# Patient Record
Sex: Female | Born: 1950
Health system: Southern US, Community
[De-identification: ages and names within clinical notes are randomized; demographics above are authoritative.]

## PROBLEM LIST (undated history)

## (undated) DIAGNOSIS — Z720 Tobacco use: Secondary | ICD-10-CM

## (undated) DIAGNOSIS — I10 Essential (primary) hypertension: Secondary | ICD-10-CM

## (undated) DIAGNOSIS — F411 Generalized anxiety disorder: Secondary | ICD-10-CM

## (undated) DIAGNOSIS — J449 Chronic obstructive pulmonary disease, unspecified: Secondary | ICD-10-CM

## (undated) DIAGNOSIS — J302 Other seasonal allergic rhinitis: Secondary | ICD-10-CM

## (undated) HISTORY — DX: Essential (primary) hypertension: I10

## (undated) HISTORY — DX: Other seasonal allergic rhinitis: J30.2

## (undated) HISTORY — DX: Chronic obstructive pulmonary disease, unspecified: J44.9

## (undated) HISTORY — DX: Tobacco use: Z72.0

## (undated) HISTORY — DX: Generalized anxiety disorder: F41.1

---

## 2016-07-03 DIAGNOSIS — J449 Chronic obstructive pulmonary disease, unspecified: Secondary | ICD-10-CM | POA: Diagnosis not present

## 2016-07-03 DIAGNOSIS — Z23 Encounter for immunization: Secondary | ICD-10-CM | POA: Diagnosis not present

## 2016-07-03 DIAGNOSIS — M545 Low back pain: Secondary | ICD-10-CM | POA: Diagnosis not present

## 2016-10-29 DIAGNOSIS — R05 Cough: Secondary | ICD-10-CM | POA: Diagnosis not present

## 2016-10-30 DIAGNOSIS — R0602 Shortness of breath: Secondary | ICD-10-CM | POA: Diagnosis not present

## 2016-10-30 DIAGNOSIS — R05 Cough: Secondary | ICD-10-CM | POA: Diagnosis not present

## 2016-11-15 DIAGNOSIS — J40 Bronchitis, not specified as acute or chronic: Secondary | ICD-10-CM | POA: Diagnosis not present

## 2016-11-15 DIAGNOSIS — R05 Cough: Secondary | ICD-10-CM | POA: Diagnosis not present

## 2016-11-19 DIAGNOSIS — J441 Chronic obstructive pulmonary disease with (acute) exacerbation: Secondary | ICD-10-CM | POA: Diagnosis not present

## 2016-11-19 DIAGNOSIS — J189 Pneumonia, unspecified organism: Secondary | ICD-10-CM | POA: Diagnosis not present

## 2016-11-19 DIAGNOSIS — K219 Gastro-esophageal reflux disease without esophagitis: Secondary | ICD-10-CM | POA: Diagnosis present

## 2016-11-19 DIAGNOSIS — E878 Other disorders of electrolyte and fluid balance, not elsewhere classified: Secondary | ICD-10-CM | POA: Diagnosis not present

## 2016-11-19 DIAGNOSIS — E042 Nontoxic multinodular goiter: Secondary | ICD-10-CM | POA: Diagnosis not present

## 2016-11-19 DIAGNOSIS — Z87891 Personal history of nicotine dependence: Secondary | ICD-10-CM | POA: Diagnosis not present

## 2016-11-19 DIAGNOSIS — I10 Essential (primary) hypertension: Secondary | ICD-10-CM | POA: Diagnosis present

## 2016-11-19 DIAGNOSIS — I513 Intracardiac thrombosis, not elsewhere classified: Secondary | ICD-10-CM | POA: Diagnosis present

## 2016-11-19 DIAGNOSIS — R069 Unspecified abnormalities of breathing: Secondary | ICD-10-CM | POA: Diagnosis not present

## 2016-11-19 DIAGNOSIS — E871 Hypo-osmolality and hyponatremia: Secondary | ICD-10-CM | POA: Diagnosis present

## 2016-11-19 DIAGNOSIS — J440 Chronic obstructive pulmonary disease with acute lower respiratory infection: Secondary | ICD-10-CM | POA: Diagnosis not present

## 2016-11-19 DIAGNOSIS — Z7982 Long term (current) use of aspirin: Secondary | ICD-10-CM | POA: Diagnosis not present

## 2016-11-19 DIAGNOSIS — I7409 Other arterial embolism and thrombosis of abdominal aorta: Secondary | ICD-10-CM | POA: Diagnosis not present

## 2016-11-19 DIAGNOSIS — J9621 Acute and chronic respiratory failure with hypoxia: Secondary | ICD-10-CM | POA: Diagnosis not present

## 2016-11-19 DIAGNOSIS — E041 Nontoxic single thyroid nodule: Secondary | ICD-10-CM | POA: Diagnosis not present

## 2016-11-19 DIAGNOSIS — Z79899 Other long term (current) drug therapy: Secondary | ICD-10-CM | POA: Diagnosis not present

## 2016-11-19 DIAGNOSIS — R0602 Shortness of breath: Secondary | ICD-10-CM | POA: Diagnosis not present

## 2016-11-19 DIAGNOSIS — R05 Cough: Secondary | ICD-10-CM | POA: Diagnosis not present

## 2016-11-21 DIAGNOSIS — E041 Nontoxic single thyroid nodule: Secondary | ICD-10-CM

## 2016-11-21 DIAGNOSIS — J441 Chronic obstructive pulmonary disease with (acute) exacerbation: Secondary | ICD-10-CM

## 2016-11-21 DIAGNOSIS — J9621 Acute and chronic respiratory failure with hypoxia: Secondary | ICD-10-CM

## 2016-11-21 DIAGNOSIS — J189 Pneumonia, unspecified organism: Secondary | ICD-10-CM

## 2016-11-21 DIAGNOSIS — E878 Other disorders of electrolyte and fluid balance, not elsewhere classified: Secondary | ICD-10-CM

## 2016-11-29 DIAGNOSIS — J189 Pneumonia, unspecified organism: Secondary | ICD-10-CM | POA: Diagnosis not present

## 2016-11-29 DIAGNOSIS — J449 Chronic obstructive pulmonary disease, unspecified: Secondary | ICD-10-CM | POA: Diagnosis not present

## 2016-12-27 DIAGNOSIS — J449 Chronic obstructive pulmonary disease, unspecified: Secondary | ICD-10-CM | POA: Diagnosis not present

## 2017-04-29 DIAGNOSIS — H109 Unspecified conjunctivitis: Secondary | ICD-10-CM | POA: Diagnosis not present

## 2017-04-29 DIAGNOSIS — L03818 Cellulitis of other sites: Secondary | ICD-10-CM | POA: Diagnosis not present

## 2017-07-18 DIAGNOSIS — J449 Chronic obstructive pulmonary disease, unspecified: Secondary | ICD-10-CM | POA: Diagnosis not present

## 2017-07-18 DIAGNOSIS — R0602 Shortness of breath: Secondary | ICD-10-CM | POA: Diagnosis not present

## 2017-07-21 DIAGNOSIS — R0602 Shortness of breath: Secondary | ICD-10-CM | POA: Diagnosis not present

## 2017-07-21 DIAGNOSIS — J449 Chronic obstructive pulmonary disease, unspecified: Secondary | ICD-10-CM | POA: Diagnosis not present

## 2017-07-22 DIAGNOSIS — J449 Chronic obstructive pulmonary disease, unspecified: Secondary | ICD-10-CM | POA: Diagnosis not present

## 2017-07-22 DIAGNOSIS — R0602 Shortness of breath: Secondary | ICD-10-CM | POA: Diagnosis not present

## 2017-08-11 DIAGNOSIS — J441 Chronic obstructive pulmonary disease with (acute) exacerbation: Secondary | ICD-10-CM | POA: Diagnosis not present

## 2017-09-15 DIAGNOSIS — J449 Chronic obstructive pulmonary disease, unspecified: Secondary | ICD-10-CM | POA: Diagnosis not present

## 2017-09-15 DIAGNOSIS — Z23 Encounter for immunization: Secondary | ICD-10-CM | POA: Diagnosis not present

## 2017-10-06 DIAGNOSIS — J441 Chronic obstructive pulmonary disease with (acute) exacerbation: Secondary | ICD-10-CM | POA: Diagnosis not present

## 2017-11-21 DIAGNOSIS — J189 Pneumonia, unspecified organism: Secondary | ICD-10-CM | POA: Diagnosis not present

## 2017-12-12 DIAGNOSIS — J441 Chronic obstructive pulmonary disease with (acute) exacerbation: Secondary | ICD-10-CM | POA: Diagnosis not present

## 2017-12-22 DIAGNOSIS — J189 Pneumonia, unspecified organism: Secondary | ICD-10-CM | POA: Diagnosis not present

## 2018-01-02 DIAGNOSIS — R109 Unspecified abdominal pain: Secondary | ICD-10-CM | POA: Diagnosis not present

## 2018-01-02 DIAGNOSIS — T148XXA Other injury of unspecified body region, initial encounter: Secondary | ICD-10-CM | POA: Diagnosis not present

## 2018-01-13 DIAGNOSIS — E876 Hypokalemia: Secondary | ICD-10-CM | POA: Diagnosis not present

## 2018-01-13 DIAGNOSIS — I10 Essential (primary) hypertension: Secondary | ICD-10-CM | POA: Diagnosis not present

## 2018-01-13 DIAGNOSIS — D72828 Other elevated white blood cell count: Secondary | ICD-10-CM | POA: Diagnosis not present

## 2018-01-13 DIAGNOSIS — J9601 Acute respiratory failure with hypoxia: Secondary | ICD-10-CM | POA: Diagnosis not present

## 2018-01-13 DIAGNOSIS — N179 Acute kidney failure, unspecified: Secondary | ICD-10-CM | POA: Diagnosis not present

## 2018-01-13 DIAGNOSIS — E871 Hypo-osmolality and hyponatremia: Secondary | ICD-10-CM | POA: Diagnosis not present

## 2018-01-13 DIAGNOSIS — J441 Chronic obstructive pulmonary disease with (acute) exacerbation: Secondary | ICD-10-CM | POA: Diagnosis not present

## 2018-01-13 DIAGNOSIS — J439 Emphysema, unspecified: Secondary | ICD-10-CM | POA: Diagnosis not present

## 2018-01-13 DIAGNOSIS — R0902 Hypoxemia: Secondary | ICD-10-CM | POA: Diagnosis not present

## 2018-01-14 DIAGNOSIS — R0902 Hypoxemia: Secondary | ICD-10-CM | POA: Diagnosis not present

## 2018-01-14 DIAGNOSIS — I10 Essential (primary) hypertension: Secondary | ICD-10-CM | POA: Diagnosis not present

## 2018-01-14 DIAGNOSIS — J9601 Acute respiratory failure with hypoxia: Secondary | ICD-10-CM | POA: Diagnosis not present

## 2018-01-14 DIAGNOSIS — D72828 Other elevated white blood cell count: Secondary | ICD-10-CM | POA: Diagnosis not present

## 2018-01-14 DIAGNOSIS — E876 Hypokalemia: Secondary | ICD-10-CM | POA: Diagnosis not present

## 2018-01-14 DIAGNOSIS — R0602 Shortness of breath: Secondary | ICD-10-CM | POA: Diagnosis not present

## 2018-01-14 DIAGNOSIS — N179 Acute kidney failure, unspecified: Secondary | ICD-10-CM | POA: Diagnosis not present

## 2018-01-14 DIAGNOSIS — E871 Hypo-osmolality and hyponatremia: Secondary | ICD-10-CM | POA: Diagnosis not present

## 2018-01-14 DIAGNOSIS — R739 Hyperglycemia, unspecified: Secondary | ICD-10-CM | POA: Diagnosis not present

## 2018-01-14 DIAGNOSIS — K219 Gastro-esophageal reflux disease without esophagitis: Secondary | ICD-10-CM | POA: Diagnosis not present

## 2018-01-14 DIAGNOSIS — J441 Chronic obstructive pulmonary disease with (acute) exacerbation: Secondary | ICD-10-CM | POA: Diagnosis not present

## 2018-01-14 DIAGNOSIS — J439 Emphysema, unspecified: Secondary | ICD-10-CM | POA: Diagnosis not present

## 2018-01-17 DIAGNOSIS — R0602 Shortness of breath: Secondary | ICD-10-CM | POA: Diagnosis not present

## 2018-01-17 DIAGNOSIS — I10 Essential (primary) hypertension: Secondary | ICD-10-CM | POA: Diagnosis not present

## 2018-01-17 DIAGNOSIS — J449 Chronic obstructive pulmonary disease, unspecified: Secondary | ICD-10-CM | POA: Diagnosis not present

## 2018-01-17 DIAGNOSIS — K210 Gastro-esophageal reflux disease with esophagitis: Secondary | ICD-10-CM | POA: Diagnosis not present

## 2018-01-19 DIAGNOSIS — J189 Pneumonia, unspecified organism: Secondary | ICD-10-CM | POA: Diagnosis not present

## 2018-01-21 DIAGNOSIS — J449 Chronic obstructive pulmonary disease, unspecified: Secondary | ICD-10-CM | POA: Diagnosis not present

## 2018-02-17 DIAGNOSIS — K210 Gastro-esophageal reflux disease with esophagitis: Secondary | ICD-10-CM | POA: Diagnosis not present

## 2018-02-17 DIAGNOSIS — R0602 Shortness of breath: Secondary | ICD-10-CM | POA: Diagnosis not present

## 2018-02-17 DIAGNOSIS — J449 Chronic obstructive pulmonary disease, unspecified: Secondary | ICD-10-CM | POA: Diagnosis not present

## 2018-02-17 DIAGNOSIS — I10 Essential (primary) hypertension: Secondary | ICD-10-CM | POA: Diagnosis not present

## 2018-02-19 DIAGNOSIS — J40 Bronchitis, not specified as acute or chronic: Secondary | ICD-10-CM | POA: Diagnosis not present

## 2018-02-19 DIAGNOSIS — J302 Other seasonal allergic rhinitis: Secondary | ICD-10-CM | POA: Diagnosis not present

## 2018-03-19 DIAGNOSIS — I10 Essential (primary) hypertension: Secondary | ICD-10-CM | POA: Diagnosis not present

## 2018-03-19 DIAGNOSIS — R0602 Shortness of breath: Secondary | ICD-10-CM | POA: Diagnosis not present

## 2018-03-19 DIAGNOSIS — K210 Gastro-esophageal reflux disease with esophagitis: Secondary | ICD-10-CM | POA: Diagnosis not present

## 2018-03-19 DIAGNOSIS — J449 Chronic obstructive pulmonary disease, unspecified: Secondary | ICD-10-CM | POA: Diagnosis not present

## 2018-04-19 DIAGNOSIS — K210 Gastro-esophageal reflux disease with esophagitis: Secondary | ICD-10-CM | POA: Diagnosis not present

## 2018-04-19 DIAGNOSIS — R0602 Shortness of breath: Secondary | ICD-10-CM | POA: Diagnosis not present

## 2018-04-19 DIAGNOSIS — J449 Chronic obstructive pulmonary disease, unspecified: Secondary | ICD-10-CM | POA: Diagnosis not present

## 2018-04-19 DIAGNOSIS — I10 Essential (primary) hypertension: Secondary | ICD-10-CM | POA: Diagnosis not present

## 2018-04-28 DIAGNOSIS — Z6828 Body mass index (BMI) 28.0-28.9, adult: Secondary | ICD-10-CM | POA: Diagnosis not present

## 2018-04-28 DIAGNOSIS — J449 Chronic obstructive pulmonary disease, unspecified: Secondary | ICD-10-CM | POA: Diagnosis not present

## 2018-04-28 DIAGNOSIS — Z0000 Encounter for general adult medical examination without abnormal findings: Secondary | ICD-10-CM | POA: Diagnosis not present

## 2018-04-28 DIAGNOSIS — J302 Other seasonal allergic rhinitis: Secondary | ICD-10-CM | POA: Diagnosis not present

## 2018-04-28 DIAGNOSIS — E663 Overweight: Secondary | ICD-10-CM | POA: Diagnosis not present

## 2018-04-28 DIAGNOSIS — I10 Essential (primary) hypertension: Secondary | ICD-10-CM | POA: Diagnosis not present

## 2018-04-28 DIAGNOSIS — F411 Generalized anxiety disorder: Secondary | ICD-10-CM | POA: Diagnosis not present

## 2018-04-28 DIAGNOSIS — E559 Vitamin D deficiency, unspecified: Secondary | ICD-10-CM | POA: Diagnosis not present

## 2018-04-28 DIAGNOSIS — Z1389 Encounter for screening for other disorder: Secondary | ICD-10-CM | POA: Diagnosis not present

## 2018-04-29 DIAGNOSIS — J449 Chronic obstructive pulmonary disease, unspecified: Secondary | ICD-10-CM | POA: Diagnosis not present

## 2018-04-29 DIAGNOSIS — I10 Essential (primary) hypertension: Secondary | ICD-10-CM | POA: Diagnosis not present

## 2018-04-29 DIAGNOSIS — E559 Vitamin D deficiency, unspecified: Secondary | ICD-10-CM | POA: Diagnosis not present

## 2018-05-06 DIAGNOSIS — Z1231 Encounter for screening mammogram for malignant neoplasm of breast: Secondary | ICD-10-CM | POA: Diagnosis not present

## 2018-07-29 DIAGNOSIS — Z23 Encounter for immunization: Secondary | ICD-10-CM | POA: Diagnosis not present

## 2018-07-29 DIAGNOSIS — I10 Essential (primary) hypertension: Secondary | ICD-10-CM | POA: Diagnosis not present

## 2018-08-21 DIAGNOSIS — J189 Pneumonia, unspecified organism: Secondary | ICD-10-CM | POA: Diagnosis not present

## 2018-08-28 DIAGNOSIS — J441 Chronic obstructive pulmonary disease with (acute) exacerbation: Secondary | ICD-10-CM | POA: Diagnosis not present

## 2018-10-02 DIAGNOSIS — J209 Acute bronchitis, unspecified: Secondary | ICD-10-CM | POA: Diagnosis not present

## 2018-10-02 DIAGNOSIS — J441 Chronic obstructive pulmonary disease with (acute) exacerbation: Secondary | ICD-10-CM | POA: Diagnosis not present

## 2018-10-05 DIAGNOSIS — R0602 Shortness of breath: Secondary | ICD-10-CM | POA: Diagnosis not present

## 2018-10-05 DIAGNOSIS — R079 Chest pain, unspecified: Secondary | ICD-10-CM | POA: Diagnosis not present

## 2018-10-05 DIAGNOSIS — I700 Atherosclerosis of aorta: Secondary | ICD-10-CM | POA: Diagnosis not present

## 2018-10-05 DIAGNOSIS — J441 Chronic obstructive pulmonary disease with (acute) exacerbation: Secondary | ICD-10-CM | POA: Diagnosis not present

## 2018-11-23 DIAGNOSIS — I10 Essential (primary) hypertension: Secondary | ICD-10-CM | POA: Diagnosis not present

## 2018-11-23 DIAGNOSIS — J449 Chronic obstructive pulmonary disease, unspecified: Secondary | ICD-10-CM | POA: Diagnosis not present

## 2019-01-18 DIAGNOSIS — R062 Wheezing: Secondary | ICD-10-CM | POA: Diagnosis not present

## 2019-01-18 DIAGNOSIS — B349 Viral infection, unspecified: Secondary | ICD-10-CM | POA: Diagnosis not present

## 2019-01-18 DIAGNOSIS — R509 Fever, unspecified: Secondary | ICD-10-CM | POA: Diagnosis not present

## 2019-01-29 DIAGNOSIS — J441 Chronic obstructive pulmonary disease with (acute) exacerbation: Secondary | ICD-10-CM | POA: Diagnosis not present

## 2019-02-26 DIAGNOSIS — J449 Chronic obstructive pulmonary disease, unspecified: Secondary | ICD-10-CM | POA: Diagnosis not present

## 2019-02-26 DIAGNOSIS — M25531 Pain in right wrist: Secondary | ICD-10-CM | POA: Diagnosis not present

## 2019-03-10 DIAGNOSIS — J441 Chronic obstructive pulmonary disease with (acute) exacerbation: Secondary | ICD-10-CM | POA: Diagnosis not present

## 2019-04-12 DIAGNOSIS — J302 Other seasonal allergic rhinitis: Secondary | ICD-10-CM | POA: Diagnosis not present

## 2019-04-12 DIAGNOSIS — J441 Chronic obstructive pulmonary disease with (acute) exacerbation: Secondary | ICD-10-CM | POA: Diagnosis not present

## 2019-04-22 DIAGNOSIS — J441 Chronic obstructive pulmonary disease with (acute) exacerbation: Secondary | ICD-10-CM | POA: Diagnosis not present

## 2019-05-22 DIAGNOSIS — J441 Chronic obstructive pulmonary disease with (acute) exacerbation: Secondary | ICD-10-CM | POA: Diagnosis not present

## 2019-05-26 ENCOUNTER — Encounter: Payer: Self-pay | Admitting: Pulmonary Disease

## 2019-05-26 ENCOUNTER — Ambulatory Visit (INDEPENDENT_AMBULATORY_CARE_PROVIDER_SITE_OTHER): Payer: Medicare HMO | Admitting: Pulmonary Disease

## 2019-05-26 ENCOUNTER — Other Ambulatory Visit: Payer: Self-pay

## 2019-05-26 VITALS — BP 128/66 | HR 91 | Temp 97.9°F | Ht 67.0 in | Wt 154.4 lb

## 2019-05-26 DIAGNOSIS — J9611 Chronic respiratory failure with hypoxia: Secondary | ICD-10-CM | POA: Diagnosis not present

## 2019-05-26 DIAGNOSIS — R0602 Shortness of breath: Secondary | ICD-10-CM | POA: Diagnosis not present

## 2019-05-26 NOTE — Progress Notes (Signed)
Subjective:    Patient ID: Andrea Collins, female    DOB: Mar 22, 1951, 68 y.o.   MRN: [id number]  Patient with a history of COPD emphysema  History of COPD for many years Has had 2 recent exacerbations  Recently started on oxygen supplementation She is deconditioned  Had a CT scan performed a couple years ago which showed a pneumonic process, thyroid nodule  Recently has been using Symbicort, Spiriva  Occasional cough, not really bringing up any secretions  Currently uses about 3 L of oxygen   Review of Systems  Constitutional: Negative for fever and unexpected weight change.  HENT: Negative for congestion, dental problem, ear pain, nosebleeds, postnasal drip, rhinorrhea, sinus pressure, sneezing, sore throat and trouble swallowing.   Eyes: Negative for redness and itching.  Respiratory: Positive for cough, shortness of breath and wheezing. Negative for chest tightness.   Cardiovascular: Negative for palpitations and leg swelling.  Gastrointestinal: Negative for nausea and vomiting.  Genitourinary: Negative for dysuria.  Musculoskeletal: Negative for joint swelling.  Skin: Negative for rash.  Allergic/Immunologic: Negative.  Negative for environmental allergies, food allergies and immunocompromised state.  Neurological: Negative for headaches.  Hematological: Does not bruise/bleed easily.  Psychiatric/Behavioral: Negative for dysphoric mood. The patient is not nervous/anxious.    Family History  Problem Relation Age of Onset  . Breast cancer Maternal Grandmother   . Heart attack Maternal Grandfather    History reviewed. No pertinent past medical history. Social History   Socioeconomic History  . Marital status: Widowed    Spouse name: Not on file  . Number of children: Not on file  . Years of education: Not on file  . Highest education level: Not on file  Occupational History  . Not on file  Social Needs  . Financial resource strain: Not on file  . Food  insecurity    Worry: Not on file    Inability: Not on file  . Transportation needs    Medical: Not on file    Non-medical: Not on file  Tobacco Use  . Smoking status: Former Smoker    Packs/day: 1.00    Years: 45.00    Pack years: 45.00    Types: Cigarettes    Start date: 1970    Quit date: 2015    Years since quitting: 5.5  . Smokeless tobacco: Never Used  . Tobacco comment: 1-2 packs a day  Substance and Sexual Activity  . Alcohol use: Not on file  . Drug use: Not on file  . Sexual activity: Not on file  Lifestyle  . Physical activity    Days per week: Not on file    Minutes per session: Not on file  . Stress: Not on file  Relationships  . Social Musicianconnections    Talks on phone: Not on file    Gets together: Not on file    Attends religious service: Not on file    Active member of club or organization: Not on file    Attends meetings of clubs or organizations: Not on file    Relationship status: Not on file  . Intimate partner violence    Fear of current or ex partner: Not on file    Emotionally abused: Not on file    Physically abused: Not on file    Forced sexual activity: Not on file  Other Topics Concern  . Not on file  Social History Narrative  . Not on file       Objective:  Physical Exam Constitutional:      Appearance: Normal appearance.  HENT:     Head: Normocephalic and atraumatic.  Neck:     Musculoskeletal: Normal range of motion and neck supple.  Cardiovascular:     Rate and Rhythm: Normal rate and regular rhythm.     Pulses: Normal pulses.     Heart sounds: Normal heart sounds. No murmur.  Pulmonary:     Effort: Pulmonary effort is normal. No respiratory distress.     Breath sounds: Rhonchi present.  Abdominal:     General: There is no distension.     Palpations: There is no mass.     Tenderness: There is no abdominal tenderness.  Musculoskeletal: Normal range of motion.        General: No swelling or tenderness.  Skin:    General:  Skin is warm and dry.     Coloration: Skin is not jaundiced or pale.  Neurological:     General: No focal deficit present.     Mental Status: She is alert and oriented to person, place, and time.  Psychiatric:        Mood and Affect: Mood normal.        Behavior: Behavior normal.    Vitals:   05/26/19 1524  BP: 128/66  Pulse: 91  Temp: 97.9 F (36.6 C)  SpO2: 92%      Assessment & Plan:  .  Chronic hypoxemic respiratory failure -Continue oxygen supplementation -Continue bronchodilator treatment  COPD/emphysema -Recent decompensation -Deconditioning -Continue bronchodilators-currently on Symbicort, Spiriva, albuterol  Deconditioning -Importance of physical activity discussed  Plan: -We will obtain CT scan of the chest-assess emphysema -We will obtain a pulmonary function study to assess severity of COPD - Continue bronchodilators Physical activity as discussed Continue oxygen supplementation  I will see you in the office in about 2 to 3 months

## 2019-05-26 NOTE — Patient Instructions (Signed)
Shortness of breath from COPD Emphysema  We will obtain a CT scan of your chest-it does help characterize the emphysema, this will also show if there is any abnormality regarding your recent fall  Pulmonary function test  Continue use of Spiriva, Symbicort, albuterol Continue oxygen use  Encourage regular exercise as discussed  We will see you back in the office in 2 to 3 months

## 2019-05-31 ENCOUNTER — Telehealth: Payer: Self-pay | Admitting: Pulmonary Disease

## 2019-06-01 NOTE — Telephone Encounter (Signed)
Spoke to [email or web address]  they need records to precert this ct will gather records and fax them to Rolanda Lundborg

## 2019-06-02 NOTE — Telephone Encounter (Signed)
Ok thanks [place].

## 2019-06-10 ENCOUNTER — Ambulatory Visit (INDEPENDENT_AMBULATORY_CARE_PROVIDER_SITE_OTHER)
Admission: RE | Admit: 2019-06-10 | Discharge: 2019-06-10 | Disposition: A | Discharge status: Home / Self Care | Payer: Medicare HMO | Source: Ambulatory Visit | Attending: Pulmonary Disease | Admitting: Pulmonary Disease

## 2019-06-10 ENCOUNTER — Other Ambulatory Visit: Payer: Self-pay

## 2019-06-10 DIAGNOSIS — R0602 Shortness of breath: Secondary | ICD-10-CM | POA: Diagnosis not present

## 2019-06-11 DIAGNOSIS — F411 Generalized anxiety disorder: Secondary | ICD-10-CM | POA: Diagnosis not present

## 2019-06-11 DIAGNOSIS — J449 Chronic obstructive pulmonary disease, unspecified: Secondary | ICD-10-CM | POA: Diagnosis not present

## 2019-06-11 DIAGNOSIS — Z0000 Encounter for general adult medical examination without abnormal findings: Secondary | ICD-10-CM | POA: Diagnosis not present

## 2019-06-11 DIAGNOSIS — J302 Other seasonal allergic rhinitis: Secondary | ICD-10-CM | POA: Diagnosis not present

## 2019-06-11 DIAGNOSIS — Z87891 Personal history of nicotine dependence: Secondary | ICD-10-CM | POA: Diagnosis not present

## 2019-06-11 DIAGNOSIS — Z1389 Encounter for screening for other disorder: Secondary | ICD-10-CM | POA: Diagnosis not present

## 2019-06-11 DIAGNOSIS — I10 Essential (primary) hypertension: Secondary | ICD-10-CM | POA: Diagnosis not present

## 2019-06-11 DIAGNOSIS — Z6823 Body mass index (BMI) 23.0-23.9, adult: Secondary | ICD-10-CM | POA: Diagnosis not present

## 2019-06-18 ENCOUNTER — Telehealth: Payer: Self-pay | Admitting: Pulmonary Disease

## 2019-06-18 DIAGNOSIS — R911 Solitary pulmonary nodule: Secondary | ICD-10-CM

## 2019-06-18 NOTE — Telephone Encounter (Signed)
Spoke with the pt  She is requesting her CT Chest results from 06/10/19  Please advise thanks

## 2019-06-21 NOTE — Telephone Encounter (Signed)
Called and spoke with pt letting her know the results of the ct and stated to her that we would repeat it in 1 year. Pt verbalized understanding. Order has been placed for ct to be performed in 1 year. Nothing further needed.

## 2019-06-21 NOTE — Telephone Encounter (Signed)
AO is in office today so he should be able to update this at that time.  Dr. Ander Slade, please advise on pt's CT results as she is calling requesting to know the results. Thanks!

## 2019-06-21 NOTE — Telephone Encounter (Signed)
CT chest is significant for emphysema  Incidental 2 mm nodule-very very small, no intervention is required A repeat CT scan of the chest in a year will be ordered

## 2019-06-22 DIAGNOSIS — J441 Chronic obstructive pulmonary disease with (acute) exacerbation: Secondary | ICD-10-CM | POA: Diagnosis not present

## 2019-07-13 DIAGNOSIS — J441 Chronic obstructive pulmonary disease with (acute) exacerbation: Secondary | ICD-10-CM | POA: Diagnosis not present

## 2019-07-23 DIAGNOSIS — J441 Chronic obstructive pulmonary disease with (acute) exacerbation: Secondary | ICD-10-CM | POA: Diagnosis not present

## 2019-08-12 DIAGNOSIS — J441 Chronic obstructive pulmonary disease with (acute) exacerbation: Secondary | ICD-10-CM | POA: Diagnosis not present

## 2019-08-22 DIAGNOSIS — J441 Chronic obstructive pulmonary disease with (acute) exacerbation: Secondary | ICD-10-CM | POA: Diagnosis not present

## 2019-09-12 DIAGNOSIS — J441 Chronic obstructive pulmonary disease with (acute) exacerbation: Secondary | ICD-10-CM | POA: Diagnosis not present

## 2019-09-14 DIAGNOSIS — I10 Essential (primary) hypertension: Secondary | ICD-10-CM | POA: Diagnosis not present

## 2019-09-22 DIAGNOSIS — J441 Chronic obstructive pulmonary disease with (acute) exacerbation: Secondary | ICD-10-CM | POA: Diagnosis not present

## 2019-10-06 DIAGNOSIS — I10 Essential (primary) hypertension: Secondary | ICD-10-CM | POA: Diagnosis not present

## 2019-10-06 DIAGNOSIS — J441 Chronic obstructive pulmonary disease with (acute) exacerbation: Secondary | ICD-10-CM | POA: Diagnosis not present

## 2019-10-06 DIAGNOSIS — J209 Acute bronchitis, unspecified: Secondary | ICD-10-CM | POA: Diagnosis not present

## 2019-10-12 DIAGNOSIS — J441 Chronic obstructive pulmonary disease with (acute) exacerbation: Secondary | ICD-10-CM | POA: Diagnosis not present

## 2019-10-22 DIAGNOSIS — J441 Chronic obstructive pulmonary disease with (acute) exacerbation: Secondary | ICD-10-CM | POA: Diagnosis not present

## 2019-11-12 DIAGNOSIS — J441 Chronic obstructive pulmonary disease with (acute) exacerbation: Secondary | ICD-10-CM | POA: Diagnosis not present

## 2019-11-22 DIAGNOSIS — J441 Chronic obstructive pulmonary disease with (acute) exacerbation: Secondary | ICD-10-CM | POA: Diagnosis not present

## 2019-12-13 DIAGNOSIS — J441 Chronic obstructive pulmonary disease with (acute) exacerbation: Secondary | ICD-10-CM | POA: Diagnosis not present

## 2019-12-17 ENCOUNTER — Telehealth: Payer: Self-pay | Admitting: Pulmonary Disease

## 2019-12-17 DIAGNOSIS — G4700 Insomnia, unspecified: Secondary | ICD-10-CM | POA: Diagnosis not present

## 2019-12-17 DIAGNOSIS — F411 Generalized anxiety disorder: Secondary | ICD-10-CM | POA: Diagnosis not present

## 2019-12-17 NOTE — Telephone Encounter (Signed)
Instructions from last OV 05/26/19  Shortness of breath from COPD Emphysema  We will obtain a CT scan of your chest-it does help characterize the emphysema, this will also show if there is any abnormality regarding your recent fall  Pulmonary function test  Continue use of Spiriva, Symbicort, albuterol Continue oxygen use  Encourage regular exercise as discussed  We will see you back in the office in 2 to 3 months     Pt has not had PFT which was ordered at last OV by AO nor has pt been scheduled for a f/u with AO.  Attempted to call pt but unable to reach. Left message for pt to return call.

## 2019-12-20 NOTE — Telephone Encounter (Signed)
LMTCB x2 for pt 

## 2019-12-21 NOTE — Telephone Encounter (Signed)
Spoke with pt, I advised her of Dr. Wynona Neat recommendations from the last OV. I advised her that she needed a repeat CT scan in August and that she needed a PFT and OV with AO. She agreed.   PFT scheduled on 05/11/2020 1pm  Covid testing 05/08/2020 at 1pm  Nothing further is needed.

## 2019-12-23 DIAGNOSIS — J441 Chronic obstructive pulmonary disease with (acute) exacerbation: Secondary | ICD-10-CM | POA: Diagnosis not present

## 2020-01-10 DIAGNOSIS — J441 Chronic obstructive pulmonary disease with (acute) exacerbation: Secondary | ICD-10-CM | POA: Diagnosis not present

## 2020-01-12 DIAGNOSIS — H3589 Other specified retinal disorders: Secondary | ICD-10-CM | POA: Diagnosis not present

## 2020-01-12 DIAGNOSIS — H40003 Preglaucoma, unspecified, bilateral: Secondary | ICD-10-CM | POA: Diagnosis not present

## 2020-01-12 DIAGNOSIS — H524 Presbyopia: Secondary | ICD-10-CM | POA: Diagnosis not present

## 2020-01-12 DIAGNOSIS — H2513 Age-related nuclear cataract, bilateral: Secondary | ICD-10-CM | POA: Diagnosis not present

## 2020-01-12 DIAGNOSIS — H5203 Hypermetropia, bilateral: Secondary | ICD-10-CM | POA: Diagnosis not present

## 2020-01-12 DIAGNOSIS — H52203 Unspecified astigmatism, bilateral: Secondary | ICD-10-CM | POA: Diagnosis not present

## 2020-01-20 DIAGNOSIS — J441 Chronic obstructive pulmonary disease with (acute) exacerbation: Secondary | ICD-10-CM | POA: Diagnosis not present

## 2020-02-10 DIAGNOSIS — J441 Chronic obstructive pulmonary disease with (acute) exacerbation: Secondary | ICD-10-CM | POA: Diagnosis not present

## 2020-02-20 DIAGNOSIS — J441 Chronic obstructive pulmonary disease with (acute) exacerbation: Secondary | ICD-10-CM | POA: Diagnosis not present

## 2020-03-11 DIAGNOSIS — J441 Chronic obstructive pulmonary disease with (acute) exacerbation: Secondary | ICD-10-CM | POA: Diagnosis not present

## 2020-03-14 DIAGNOSIS — I10 Essential (primary) hypertension: Secondary | ICD-10-CM | POA: Diagnosis not present

## 2020-03-14 DIAGNOSIS — F411 Generalized anxiety disorder: Secondary | ICD-10-CM | POA: Diagnosis not present

## 2020-03-21 DIAGNOSIS — J441 Chronic obstructive pulmonary disease with (acute) exacerbation: Secondary | ICD-10-CM | POA: Diagnosis not present

## 2020-04-11 DIAGNOSIS — J441 Chronic obstructive pulmonary disease with (acute) exacerbation: Secondary | ICD-10-CM | POA: Diagnosis not present

## 2020-04-21 DIAGNOSIS — J441 Chronic obstructive pulmonary disease with (acute) exacerbation: Secondary | ICD-10-CM | POA: Diagnosis not present

## 2020-05-08 ENCOUNTER — Ambulatory Visit: Payer: Medicare HMO | Attending: Internal Medicine

## 2020-05-08 ENCOUNTER — Other Ambulatory Visit (HOSPITAL_COMMUNITY): Payer: Medicare HMO

## 2020-05-08 DIAGNOSIS — Z20822 Contact with and (suspected) exposure to covid-19: Secondary | ICD-10-CM

## 2020-05-09 LAB — SARS-COV-2, NAA 2 DAY TAT

## 2020-05-09 LAB — NOVEL CORONAVIRUS, NAA: SARS-CoV-2, NAA: NOT DETECTED

## 2020-05-11 ENCOUNTER — Ambulatory Visit (INDEPENDENT_AMBULATORY_CARE_PROVIDER_SITE_OTHER): Payer: Medicare HMO | Admitting: Pulmonary Disease

## 2020-05-11 ENCOUNTER — Other Ambulatory Visit: Payer: Self-pay

## 2020-05-11 DIAGNOSIS — R0602 Shortness of breath: Secondary | ICD-10-CM | POA: Diagnosis not present

## 2020-05-11 DIAGNOSIS — J441 Chronic obstructive pulmonary disease with (acute) exacerbation: Secondary | ICD-10-CM | POA: Diagnosis not present

## 2020-05-11 LAB — PULMONARY FUNCTION TEST
DL/VA % pred: 31 %
DL/VA: 1.28 ml/min/mmHg/L
DLCO cor % pred: 27 %
DLCO cor: 5.85 ml/min/mmHg
DLCO unc % pred: 27 %
DLCO unc: 5.85 ml/min/mmHg
FEF 25-75 Post: 0.39 L/sec
FEF 25-75 Pre: 0.36 L/sec
FEF2575-%Change-Post: 6 %
FEF2575-%Pred-Post: 18 %
FEF2575-%Pred-Pre: 17 %
FEV1-%Change-Post: 0 %
FEV1-%Pred-Post: 35 %
FEV1-%Pred-Pre: 35 %
FEV1-Post: 0.9 L
FEV1-Pre: 0.9 L
FEV1FVC-%Change-Post: -6 %
FEV1FVC-%Pred-Pre: 54 %
FEV6-%Change-Post: 5 %
FEV6-%Pred-Post: 65 %
FEV6-%Pred-Pre: 62 %
FEV6-Post: 2.09 L
FEV6-Pre: 1.98 L
FEV6FVC-%Change-Post: 0 %
FEV6FVC-%Pred-Post: 94 %
FEV6FVC-%Pred-Pre: 95 %
FVC-%Change-Post: 6 %
FVC-%Pred-Post: 69 %
FVC-%Pred-Pre: 65 %
FVC-Post: 2.3 L
FVC-Pre: 2.16 L
Post FEV1/FVC ratio: 39 %
Post FEV6/FVC ratio: 91 %
Pre FEV1/FVC ratio: 42 %
Pre FEV6/FVC Ratio: 92 %

## 2020-05-11 NOTE — Progress Notes (Signed)
PFT done today. 

## 2020-05-21 DIAGNOSIS — J441 Chronic obstructive pulmonary disease with (acute) exacerbation: Secondary | ICD-10-CM | POA: Diagnosis not present

## 2020-05-31 DIAGNOSIS — J441 Chronic obstructive pulmonary disease with (acute) exacerbation: Secondary | ICD-10-CM | POA: Diagnosis not present

## 2020-06-09 ENCOUNTER — Other Ambulatory Visit: Payer: Medicare HMO

## 2020-06-09 ENCOUNTER — Other Ambulatory Visit: Payer: Self-pay

## 2020-06-09 ENCOUNTER — Ambulatory Visit (INDEPENDENT_AMBULATORY_CARE_PROVIDER_SITE_OTHER)
Admission: RE | Admit: 2020-06-09 | Discharge: 2020-06-09 | Disposition: A | Discharge status: Home / Self Care | Payer: Medicare HMO | Source: Ambulatory Visit | Attending: Pulmonary Disease | Admitting: Pulmonary Disease

## 2020-06-09 DIAGNOSIS — J439 Emphysema, unspecified: Secondary | ICD-10-CM | POA: Diagnosis not present

## 2020-06-09 DIAGNOSIS — R911 Solitary pulmonary nodule: Secondary | ICD-10-CM | POA: Diagnosis not present

## 2020-06-09 DIAGNOSIS — I700 Atherosclerosis of aorta: Secondary | ICD-10-CM | POA: Diagnosis not present

## 2020-06-09 DIAGNOSIS — I2510 Atherosclerotic heart disease of native coronary artery without angina pectoris: Secondary | ICD-10-CM | POA: Diagnosis not present

## 2020-06-11 DIAGNOSIS — J441 Chronic obstructive pulmonary disease with (acute) exacerbation: Secondary | ICD-10-CM | POA: Diagnosis not present

## 2020-06-21 DIAGNOSIS — J441 Chronic obstructive pulmonary disease with (acute) exacerbation: Secondary | ICD-10-CM | POA: Diagnosis not present

## 2020-06-28 ENCOUNTER — Other Ambulatory Visit: Payer: Self-pay

## 2020-06-28 ENCOUNTER — Ambulatory Visit: Payer: Medicare HMO | Admitting: Pulmonary Disease

## 2020-06-28 ENCOUNTER — Encounter: Payer: Self-pay | Admitting: Pulmonary Disease

## 2020-06-28 ENCOUNTER — Other Ambulatory Visit: Payer: Self-pay | Admitting: Pulmonary Disease

## 2020-06-28 VITALS — BP 124/74 | HR 113 | Temp 97.9°F | Ht 67.0 in | Wt 172.6 lb

## 2020-06-28 DIAGNOSIS — R0602 Shortness of breath: Secondary | ICD-10-CM

## 2020-06-28 DIAGNOSIS — J431 Panlobular emphysema: Secondary | ICD-10-CM

## 2020-06-28 DIAGNOSIS — R9389 Abnormal findings on diagnostic imaging of other specified body structures: Secondary | ICD-10-CM

## 2020-06-28 MED ORDER — TIOTROPIUM BROMIDE MONOHYDRATE 18 MCG IN CAPS: 18.0000 ug | ORAL_CAPSULE | Freq: Every day | RESPIRATORY_TRACT | 3 refills | Status: AC

## 2020-06-28 NOTE — Patient Instructions (Signed)
Chronic obstructive pulmonary disease -Stable  Tiny lung nodule -Stable  Continue bronchodilators Continue oxygen supplementation  Graded exercise as tolerated  Call with significant concerns  Follow-up in 6 months

## 2020-06-28 NOTE — Progress Notes (Signed)
Subjective:    Patient ID: Andrea Collins, female    DOB: 1951/07/01, 69 y.o.   MRN: [id number]  Patient with a history of COPD emphysema  History of COPD for many years Has had 2 recent exacerbations  Has been stable since her last office visit Continues to use oxygen supplementation  Trying to stay active and work actively  Had a CT scan performed a couple years ago which showed a pneumonic process, thyroid nodule CT scan of the chest shows 2 mm nodule  Recently has been using Symbicort, Spiriva  Currently uses about 3 L of oxygen   Review of Systems  Constitutional: Negative for fever and unexpected weight change.  HENT: Negative for congestion, dental problem, ear pain, nosebleeds, postnasal drip, rhinorrhea, sinus pressure, sneezing, sore throat and trouble swallowing.   Eyes: Negative for redness and itching.  Respiratory: Positive for cough, shortness of breath and wheezing. Negative for chest tightness.   Cardiovascular: Negative for palpitations and leg swelling.  Gastrointestinal: Negative for nausea and vomiting.  Genitourinary: Negative for dysuria.  Musculoskeletal: Negative for joint swelling.  Skin: Negative for rash.  Allergic/Immunologic: Negative.  Negative for environmental allergies, food allergies and immunocompromised state.  Neurological: Negative for headaches.  Hematological: Does not bruise/bleed easily.  Psychiatric/Behavioral: Negative for dysphoric mood. The patient is not nervous/anxious.    Family History  Problem Relation Age of Onset  . Breast cancer Maternal Grandmother   . Heart attack Maternal Grandfather    No past medical history on file. Social History   Socioeconomic History  . Marital status: Widowed    Spouse name: Not on file  . Number of children: Not on file  . Years of education: Not on file  . Highest education level: Not on file  Occupational History  . Not on file  Tobacco Use  . Smoking status: Former  Smoker    Packs/day: 1.00    Years: 45.00    Pack years: 45.00    Types: Cigarettes    Start date: 1970    Quit date: 2015    Years since quitting: 6.6  . Smokeless tobacco: Never Used  . Tobacco comment: 1-2 packs a day  Substance and Sexual Activity  . Alcohol use: Not on file  . Drug use: Not on file  . Sexual activity: Not on file  Other Topics Concern  . Not on file  Social History Narrative  . Not on file   Social Determinants of Health   Financial Resource Strain:   . Difficulty of Paying Living Expenses:   Food Insecurity:   . Worried About Programme researcher, broadcasting/film/video in the Last Year:   . Barista in the Last Year:   Transportation Needs:   . Freight forwarder (Medical):   Marland Kitchen Lack of Transportation (Non-Medical):   Physical Activity:   . Days of Exercise per Week:   . Minutes of Exercise per Session:   Stress:   . Feeling of Stress :   Social Connections:   . Frequency of Communication with Friends and Family:   . Frequency of Social Gatherings with Friends and Family:   . Attends Religious Services:   . Active Member of Clubs or Organizations:   . Attends Banker Meetings:   Marland Kitchen Marital Status:   Intimate Partner Violence:   . Fear of Current or Ex-Partner:   . Emotionally Abused:   Marland Kitchen Physically Abused:   . Sexually Abused:  Objective:   Physical Exam Constitutional:      Appearance: Normal appearance.  HENT:     Head: Normocephalic and atraumatic.     Mouth/Throat:     Mouth: Mucous membranes are moist.  Cardiovascular:     Rate and Rhythm: Normal rate and regular rhythm.     Pulses: Normal pulses.     Heart sounds: Normal heart sounds. No murmur heard.   Pulmonary:     Effort: Pulmonary effort is normal. No respiratory distress.     Breath sounds: No stridor.     Comments: Decreased air movement bilaterally Abdominal:     Tenderness: There is no abdominal tenderness.  Musculoskeletal:        General: No swelling.  Normal range of motion.     Cervical back: No rigidity or tenderness.  Skin:    General: Skin is warm.  Neurological:     Mental Status: She is alert.    Vitals:   06/28/20 1359  BP: 124/74  Pulse: (!) 113  Temp: 97.9 F (36.6 C)  SpO2: 95%   Pulmonary function test reviewed with the patient showing severe obstructive lung disease  CT scan of the chest shows evidence of emphysema    Assessment & Plan:  .  Chronic hypoxemic respiratory failure -Bronchodilator treatments Continue oxygen supplementation  COPD/emphysema -Deconditioning -Continue bronchodilators-currently on Symbicort, Spiriva, albuterol  Deconditioning -Importance of physical activity discussed -Encouraged to continue to stay very active  Small lung nodule -Does not need follow-up  Plan: -No changes Okay -Continue bronchodilators  -Continue oxygen supplementation  Graded exercises as tolerated  Follow-up in 6 months -

## 2020-07-12 DIAGNOSIS — J441 Chronic obstructive pulmonary disease with (acute) exacerbation: Secondary | ICD-10-CM | POA: Diagnosis not present

## 2020-07-21 DIAGNOSIS — J449 Chronic obstructive pulmonary disease, unspecified: Secondary | ICD-10-CM | POA: Diagnosis not present

## 2020-07-21 DIAGNOSIS — Z23 Encounter for immunization: Secondary | ICD-10-CM | POA: Diagnosis not present

## 2020-07-21 DIAGNOSIS — Z6827 Body mass index (BMI) 27.0-27.9, adult: Secondary | ICD-10-CM | POA: Diagnosis not present

## 2020-07-21 DIAGNOSIS — Z1389 Encounter for screening for other disorder: Secondary | ICD-10-CM | POA: Diagnosis not present

## 2020-07-21 DIAGNOSIS — J302 Other seasonal allergic rhinitis: Secondary | ICD-10-CM | POA: Diagnosis not present

## 2020-07-21 DIAGNOSIS — F411 Generalized anxiety disorder: Secondary | ICD-10-CM | POA: Diagnosis not present

## 2020-07-21 DIAGNOSIS — Z0000 Encounter for general adult medical examination without abnormal findings: Secondary | ICD-10-CM | POA: Diagnosis not present

## 2020-07-21 DIAGNOSIS — I10 Essential (primary) hypertension: Secondary | ICD-10-CM | POA: Diagnosis not present

## 2020-07-22 DIAGNOSIS — J441 Chronic obstructive pulmonary disease with (acute) exacerbation: Secondary | ICD-10-CM | POA: Diagnosis not present

## 2020-08-02 DIAGNOSIS — Z1231 Encounter for screening mammogram for malignant neoplasm of breast: Secondary | ICD-10-CM | POA: Diagnosis not present

## 2020-08-11 DIAGNOSIS — J441 Chronic obstructive pulmonary disease with (acute) exacerbation: Secondary | ICD-10-CM | POA: Diagnosis not present

## 2020-08-21 DIAGNOSIS — J441 Chronic obstructive pulmonary disease with (acute) exacerbation: Secondary | ICD-10-CM | POA: Diagnosis not present

## 2020-09-11 DIAGNOSIS — J441 Chronic obstructive pulmonary disease with (acute) exacerbation: Secondary | ICD-10-CM | POA: Diagnosis not present

## 2020-09-18 DIAGNOSIS — W19XXXA Unspecified fall, initial encounter: Secondary | ICD-10-CM | POA: Diagnosis not present

## 2020-09-18 DIAGNOSIS — J90 Pleural effusion, not elsewhere classified: Secondary | ICD-10-CM | POA: Diagnosis not present

## 2020-09-18 DIAGNOSIS — R059 Cough, unspecified: Secondary | ICD-10-CM | POA: Diagnosis not present

## 2020-09-18 DIAGNOSIS — R531 Weakness: Secondary | ICD-10-CM | POA: Diagnosis not present

## 2020-09-18 DIAGNOSIS — R0602 Shortness of breath: Secondary | ICD-10-CM | POA: Diagnosis not present

## 2020-09-18 DIAGNOSIS — U071 COVID-19: Secondary | ICD-10-CM | POA: Diagnosis not present

## 2020-09-18 DIAGNOSIS — J449 Chronic obstructive pulmonary disease, unspecified: Secondary | ICD-10-CM | POA: Diagnosis not present

## 2020-09-18 DIAGNOSIS — R0902 Hypoxemia: Secondary | ICD-10-CM | POA: Diagnosis not present

## 2020-09-18 DIAGNOSIS — R000 Tachycardia, unspecified: Secondary | ICD-10-CM | POA: Diagnosis not present

## 2020-09-18 DIAGNOSIS — R778 Other specified abnormalities of plasma proteins: Secondary | ICD-10-CM | POA: Diagnosis not present

## 2020-09-18 DIAGNOSIS — R0689 Other abnormalities of breathing: Secondary | ICD-10-CM | POA: Diagnosis not present

## 2020-09-18 DIAGNOSIS — E041 Nontoxic single thyroid nodule: Secondary | ICD-10-CM | POA: Diagnosis not present

## 2020-09-18 DIAGNOSIS — I2699 Other pulmonary embolism without acute cor pulmonale: Secondary | ICD-10-CM | POA: Diagnosis not present

## 2020-09-19 DIAGNOSIS — E876 Hypokalemia: Secondary | ICD-10-CM | POA: Diagnosis not present

## 2020-09-19 DIAGNOSIS — Z9981 Dependence on supplemental oxygen: Secondary | ICD-10-CM | POA: Diagnosis not present

## 2020-09-19 DIAGNOSIS — R059 Cough, unspecified: Secondary | ICD-10-CM | POA: Diagnosis not present

## 2020-09-19 DIAGNOSIS — I10 Essential (primary) hypertension: Secondary | ICD-10-CM | POA: Diagnosis not present

## 2020-09-19 DIAGNOSIS — J209 Acute bronchitis, unspecified: Secondary | ICD-10-CM | POA: Diagnosis not present

## 2020-09-19 DIAGNOSIS — J441 Chronic obstructive pulmonary disease with (acute) exacerbation: Secondary | ICD-10-CM | POA: Diagnosis not present

## 2020-09-19 DIAGNOSIS — K219 Gastro-esophageal reflux disease without esophagitis: Secondary | ICD-10-CM | POA: Diagnosis not present

## 2020-09-19 DIAGNOSIS — I2699 Other pulmonary embolism without acute cor pulmonale: Secondary | ICD-10-CM | POA: Diagnosis not present

## 2020-09-19 DIAGNOSIS — R0902 Hypoxemia: Secondary | ICD-10-CM | POA: Diagnosis not present

## 2020-09-19 DIAGNOSIS — R0602 Shortness of breath: Secondary | ICD-10-CM | POA: Diagnosis not present

## 2020-09-19 DIAGNOSIS — J449 Chronic obstructive pulmonary disease, unspecified: Secondary | ICD-10-CM | POA: Diagnosis not present

## 2020-09-19 DIAGNOSIS — J439 Emphysema, unspecified: Secondary | ICD-10-CM | POA: Diagnosis not present

## 2020-09-19 DIAGNOSIS — J90 Pleural effusion, not elsewhere classified: Secondary | ICD-10-CM | POA: Diagnosis not present

## 2020-09-19 DIAGNOSIS — M1990 Unspecified osteoarthritis, unspecified site: Secondary | ICD-10-CM | POA: Diagnosis not present

## 2020-09-19 DIAGNOSIS — U071 COVID-19: Secondary | ICD-10-CM | POA: Diagnosis not present

## 2020-09-19 DIAGNOSIS — E041 Nontoxic single thyroid nodule: Secondary | ICD-10-CM | POA: Diagnosis not present

## 2020-09-19 DIAGNOSIS — J9621 Acute and chronic respiratory failure with hypoxia: Secondary | ICD-10-CM | POA: Diagnosis not present

## 2020-09-19 DIAGNOSIS — R778 Other specified abnormalities of plasma proteins: Secondary | ICD-10-CM | POA: Diagnosis not present

## 2020-09-21 DIAGNOSIS — J441 Chronic obstructive pulmonary disease with (acute) exacerbation: Secondary | ICD-10-CM | POA: Diagnosis not present

## 2020-09-27 ENCOUNTER — Other Ambulatory Visit: Payer: Self-pay

## 2020-09-27 NOTE — Patient Outreach (Signed)
[facility]) Care Management  09/27/2020  Andrea Collins 1951/10/31 [id number]   Referral Date: 09/27/20 Referral Source:[employer] Report Referral Reason: Discharge [facility] 09/25/20   Outreach Attempt: No answer.  HIPAA compliant voice message left.     Plan: RN CM will attempt patient again within the next 4 business days and send letter.    [name], RN, MSN [facility] Care Management Care Management Coordinator Direct Line [phone] Toll Free: [phone]  Fax: [phone]

## 2020-09-28 ENCOUNTER — Other Ambulatory Visit: Payer: Self-pay

## 2020-09-28 NOTE — Patient Outreach (Signed)
[facility]) Care Management  09/28/2020  Andrea Collins [birth year] [id number]   Referral Date: 09/27/20 Referral Source:[employer] Report Referral Reason: Discharge [facility] 09/25/20   Outreach Attempt: No answer.  HIPAA compliant voice message left.     Plan: RN CM will attempt patient again within the next 4 business days.  [name], RN, MSN [facility] Care Management Care Management Coordinator Direct Line [phone] Cell [phone] Toll Free: [phone]  Fax: [phone]

## 2020-09-29 ENCOUNTER — Other Ambulatory Visit: Payer: Self-pay

## 2020-09-29 NOTE — Patient Outreach (Signed)
[facility]) Care Management  09/29/2020  Lashia Niese 1951-07-12 [id number]   Referral Date: 09/27/20 Referral Source:[employer] Report Referral Reason: Discharge [facility] 09/25/20   Outreach Attempt: No answer.  HIPAA compliant voice message left.     Plan: RN CM will attempt patient again within the next 3-4 weeks.   [name], RN, MSN [facility] Care Management Care Management Coordinator Direct Line [phone] Cell [phone] Toll Free: [phone]  Fax: [phone]

## 2020-10-11 DIAGNOSIS — R531 Weakness: Secondary | ICD-10-CM | POA: Diagnosis not present

## 2020-10-11 DIAGNOSIS — J441 Chronic obstructive pulmonary disease with (acute) exacerbation: Secondary | ICD-10-CM | POA: Diagnosis not present

## 2020-10-11 DIAGNOSIS — J449 Chronic obstructive pulmonary disease, unspecified: Secondary | ICD-10-CM | POA: Diagnosis not present

## 2020-10-18 ENCOUNTER — Other Ambulatory Visit: Payer: Self-pay

## 2020-10-18 NOTE — Patient Outreach (Signed)
[facility]) Care Management  10/18/2020  [name] 26-Aug-1951 [id number]   Referral Date:09/27/20 Referral Source:[employer] Report Referral Reason:Discharge [facility] 09/25/20   Outreach Attempt:No answer. Mailbox full unable to leave a message.  Plan:RN CM will close case.  [name], RN, MSN [facility] Care Management Care Management Coordinator Direct Line [phone] Cell [phone] Toll Free: [phone]  Fax: [phone]

## 2020-10-21 DIAGNOSIS — J441 Chronic obstructive pulmonary disease with (acute) exacerbation: Secondary | ICD-10-CM | POA: Diagnosis not present

## 2020-11-11 DIAGNOSIS — J441 Chronic obstructive pulmonary disease with (acute) exacerbation: Secondary | ICD-10-CM | POA: Diagnosis not present

## 2020-11-21 DIAGNOSIS — J441 Chronic obstructive pulmonary disease with (acute) exacerbation: Secondary | ICD-10-CM | POA: Diagnosis not present

## 2020-11-24 DIAGNOSIS — J9610 Chronic respiratory failure, unspecified whether with hypoxia or hypercapnia: Secondary | ICD-10-CM | POA: Diagnosis not present

## 2020-11-24 DIAGNOSIS — U099 Post covid-19 condition, unspecified: Secondary | ICD-10-CM | POA: Diagnosis not present

## 2020-11-24 DIAGNOSIS — J449 Chronic obstructive pulmonary disease, unspecified: Secondary | ICD-10-CM | POA: Diagnosis not present

## 2020-11-24 DIAGNOSIS — R053 Chronic cough: Secondary | ICD-10-CM | POA: Diagnosis not present

## 2020-12-12 DIAGNOSIS — J441 Chronic obstructive pulmonary disease with (acute) exacerbation: Secondary | ICD-10-CM | POA: Diagnosis not present

## 2020-12-22 DIAGNOSIS — J441 Chronic obstructive pulmonary disease with (acute) exacerbation: Secondary | ICD-10-CM | POA: Diagnosis not present

## 2020-12-25 DIAGNOSIS — J449 Chronic obstructive pulmonary disease, unspecified: Secondary | ICD-10-CM | POA: Diagnosis not present

## 2020-12-25 DIAGNOSIS — R531 Weakness: Secondary | ICD-10-CM | POA: Diagnosis not present

## 2020-12-25 DIAGNOSIS — U099 Post covid-19 condition, unspecified: Secondary | ICD-10-CM | POA: Diagnosis not present

## 2020-12-25 DIAGNOSIS — R5381 Other malaise: Secondary | ICD-10-CM | POA: Diagnosis not present

## 2020-12-25 DIAGNOSIS — R262 Difficulty in walking, not elsewhere classified: Secondary | ICD-10-CM | POA: Diagnosis not present

## 2020-12-27 DIAGNOSIS — Z9981 Dependence on supplemental oxygen: Secondary | ICD-10-CM | POA: Diagnosis not present

## 2020-12-27 DIAGNOSIS — J9621 Acute and chronic respiratory failure with hypoxia: Secondary | ICD-10-CM | POA: Diagnosis not present

## 2020-12-27 DIAGNOSIS — I10 Essential (primary) hypertension: Secondary | ICD-10-CM | POA: Diagnosis not present

## 2020-12-27 DIAGNOSIS — Z87891 Personal history of nicotine dependence: Secondary | ICD-10-CM | POA: Diagnosis not present

## 2020-12-27 DIAGNOSIS — J439 Emphysema, unspecified: Secondary | ICD-10-CM | POA: Diagnosis not present

## 2020-12-27 DIAGNOSIS — U099 Post covid-19 condition, unspecified: Secondary | ICD-10-CM | POA: Diagnosis not present

## 2020-12-27 DIAGNOSIS — Z7982 Long term (current) use of aspirin: Secondary | ICD-10-CM | POA: Diagnosis not present

## 2020-12-27 DIAGNOSIS — F419 Anxiety disorder, unspecified: Secondary | ICD-10-CM | POA: Diagnosis not present

## 2020-12-27 DIAGNOSIS — J449 Chronic obstructive pulmonary disease, unspecified: Secondary | ICD-10-CM | POA: Diagnosis not present

## 2021-01-04 DIAGNOSIS — J439 Emphysema, unspecified: Secondary | ICD-10-CM | POA: Diagnosis not present

## 2021-01-04 DIAGNOSIS — U099 Post covid-19 condition, unspecified: Secondary | ICD-10-CM | POA: Diagnosis not present

## 2021-01-04 DIAGNOSIS — J9621 Acute and chronic respiratory failure with hypoxia: Secondary | ICD-10-CM | POA: Diagnosis not present

## 2021-01-04 DIAGNOSIS — Z7982 Long term (current) use of aspirin: Secondary | ICD-10-CM | POA: Diagnosis not present

## 2021-01-04 DIAGNOSIS — Z87891 Personal history of nicotine dependence: Secondary | ICD-10-CM | POA: Diagnosis not present

## 2021-01-04 DIAGNOSIS — F419 Anxiety disorder, unspecified: Secondary | ICD-10-CM | POA: Diagnosis not present

## 2021-01-04 DIAGNOSIS — Z9981 Dependence on supplemental oxygen: Secondary | ICD-10-CM | POA: Diagnosis not present

## 2021-01-04 DIAGNOSIS — I10 Essential (primary) hypertension: Secondary | ICD-10-CM | POA: Diagnosis not present

## 2021-01-05 DIAGNOSIS — Z87891 Personal history of nicotine dependence: Secondary | ICD-10-CM | POA: Diagnosis not present

## 2021-01-05 DIAGNOSIS — Z7982 Long term (current) use of aspirin: Secondary | ICD-10-CM | POA: Diagnosis not present

## 2021-01-05 DIAGNOSIS — F419 Anxiety disorder, unspecified: Secondary | ICD-10-CM | POA: Diagnosis not present

## 2021-01-05 DIAGNOSIS — U099 Post covid-19 condition, unspecified: Secondary | ICD-10-CM | POA: Diagnosis not present

## 2021-01-05 DIAGNOSIS — Z9981 Dependence on supplemental oxygen: Secondary | ICD-10-CM | POA: Diagnosis not present

## 2021-01-05 DIAGNOSIS — J9621 Acute and chronic respiratory failure with hypoxia: Secondary | ICD-10-CM | POA: Diagnosis not present

## 2021-01-05 DIAGNOSIS — I10 Essential (primary) hypertension: Secondary | ICD-10-CM | POA: Diagnosis not present

## 2021-01-05 DIAGNOSIS — J439 Emphysema, unspecified: Secondary | ICD-10-CM | POA: Diagnosis not present

## 2021-01-08 DIAGNOSIS — J439 Emphysema, unspecified: Secondary | ICD-10-CM | POA: Diagnosis not present

## 2021-01-08 DIAGNOSIS — F419 Anxiety disorder, unspecified: Secondary | ICD-10-CM | POA: Diagnosis not present

## 2021-01-08 DIAGNOSIS — J9621 Acute and chronic respiratory failure with hypoxia: Secondary | ICD-10-CM | POA: Diagnosis not present

## 2021-01-08 DIAGNOSIS — Z7982 Long term (current) use of aspirin: Secondary | ICD-10-CM | POA: Diagnosis not present

## 2021-01-08 DIAGNOSIS — I10 Essential (primary) hypertension: Secondary | ICD-10-CM | POA: Diagnosis not present

## 2021-01-08 DIAGNOSIS — Z9981 Dependence on supplemental oxygen: Secondary | ICD-10-CM | POA: Diagnosis not present

## 2021-01-08 DIAGNOSIS — Z87891 Personal history of nicotine dependence: Secondary | ICD-10-CM | POA: Diagnosis not present

## 2021-01-08 DIAGNOSIS — U099 Post covid-19 condition, unspecified: Secondary | ICD-10-CM | POA: Diagnosis not present

## 2021-01-09 DIAGNOSIS — J441 Chronic obstructive pulmonary disease with (acute) exacerbation: Secondary | ICD-10-CM | POA: Diagnosis not present

## 2021-01-11 DIAGNOSIS — I10 Essential (primary) hypertension: Secondary | ICD-10-CM | POA: Diagnosis not present

## 2021-01-11 DIAGNOSIS — U099 Post covid-19 condition, unspecified: Secondary | ICD-10-CM | POA: Diagnosis not present

## 2021-01-11 DIAGNOSIS — Z9981 Dependence on supplemental oxygen: Secondary | ICD-10-CM | POA: Diagnosis not present

## 2021-01-11 DIAGNOSIS — J9621 Acute and chronic respiratory failure with hypoxia: Secondary | ICD-10-CM | POA: Diagnosis not present

## 2021-01-11 DIAGNOSIS — F419 Anxiety disorder, unspecified: Secondary | ICD-10-CM | POA: Diagnosis not present

## 2021-01-11 DIAGNOSIS — Z87891 Personal history of nicotine dependence: Secondary | ICD-10-CM | POA: Diagnosis not present

## 2021-01-11 DIAGNOSIS — Z7982 Long term (current) use of aspirin: Secondary | ICD-10-CM | POA: Diagnosis not present

## 2021-01-11 DIAGNOSIS — J439 Emphysema, unspecified: Secondary | ICD-10-CM | POA: Diagnosis not present

## 2021-01-16 DIAGNOSIS — J9621 Acute and chronic respiratory failure with hypoxia: Secondary | ICD-10-CM | POA: Diagnosis not present

## 2021-01-16 DIAGNOSIS — Z9981 Dependence on supplemental oxygen: Secondary | ICD-10-CM | POA: Diagnosis not present

## 2021-01-16 DIAGNOSIS — Z87891 Personal history of nicotine dependence: Secondary | ICD-10-CM | POA: Diagnosis not present

## 2021-01-16 DIAGNOSIS — F419 Anxiety disorder, unspecified: Secondary | ICD-10-CM | POA: Diagnosis not present

## 2021-01-16 DIAGNOSIS — J439 Emphysema, unspecified: Secondary | ICD-10-CM | POA: Diagnosis not present

## 2021-01-16 DIAGNOSIS — Z7982 Long term (current) use of aspirin: Secondary | ICD-10-CM | POA: Diagnosis not present

## 2021-01-16 DIAGNOSIS — U099 Post covid-19 condition, unspecified: Secondary | ICD-10-CM | POA: Diagnosis not present

## 2021-01-16 DIAGNOSIS — I10 Essential (primary) hypertension: Secondary | ICD-10-CM | POA: Diagnosis not present

## 2021-01-18 DIAGNOSIS — F419 Anxiety disorder, unspecified: Secondary | ICD-10-CM | POA: Diagnosis not present

## 2021-01-18 DIAGNOSIS — J9621 Acute and chronic respiratory failure with hypoxia: Secondary | ICD-10-CM | POA: Diagnosis not present

## 2021-01-18 DIAGNOSIS — Z9981 Dependence on supplemental oxygen: Secondary | ICD-10-CM | POA: Diagnosis not present

## 2021-01-18 DIAGNOSIS — Z87891 Personal history of nicotine dependence: Secondary | ICD-10-CM | POA: Diagnosis not present

## 2021-01-18 DIAGNOSIS — Z7982 Long term (current) use of aspirin: Secondary | ICD-10-CM | POA: Diagnosis not present

## 2021-01-18 DIAGNOSIS — I10 Essential (primary) hypertension: Secondary | ICD-10-CM | POA: Diagnosis not present

## 2021-01-18 DIAGNOSIS — J439 Emphysema, unspecified: Secondary | ICD-10-CM | POA: Diagnosis not present

## 2021-01-18 DIAGNOSIS — U099 Post covid-19 condition, unspecified: Secondary | ICD-10-CM | POA: Diagnosis not present

## 2021-01-19 DIAGNOSIS — J441 Chronic obstructive pulmonary disease with (acute) exacerbation: Secondary | ICD-10-CM | POA: Diagnosis not present

## 2021-01-23 DIAGNOSIS — Z7982 Long term (current) use of aspirin: Secondary | ICD-10-CM | POA: Diagnosis not present

## 2021-01-23 DIAGNOSIS — I10 Essential (primary) hypertension: Secondary | ICD-10-CM | POA: Diagnosis not present

## 2021-01-23 DIAGNOSIS — U099 Post covid-19 condition, unspecified: Secondary | ICD-10-CM | POA: Diagnosis not present

## 2021-01-23 DIAGNOSIS — F419 Anxiety disorder, unspecified: Secondary | ICD-10-CM | POA: Diagnosis not present

## 2021-01-23 DIAGNOSIS — Z87891 Personal history of nicotine dependence: Secondary | ICD-10-CM | POA: Diagnosis not present

## 2021-01-23 DIAGNOSIS — J9621 Acute and chronic respiratory failure with hypoxia: Secondary | ICD-10-CM | POA: Diagnosis not present

## 2021-01-23 DIAGNOSIS — Z9981 Dependence on supplemental oxygen: Secondary | ICD-10-CM | POA: Diagnosis not present

## 2021-01-23 DIAGNOSIS — J439 Emphysema, unspecified: Secondary | ICD-10-CM | POA: Diagnosis not present

## 2021-01-25 DIAGNOSIS — I10 Essential (primary) hypertension: Secondary | ICD-10-CM | POA: Diagnosis not present

## 2021-01-25 DIAGNOSIS — Z9981 Dependence on supplemental oxygen: Secondary | ICD-10-CM | POA: Diagnosis not present

## 2021-01-25 DIAGNOSIS — U099 Post covid-19 condition, unspecified: Secondary | ICD-10-CM | POA: Diagnosis not present

## 2021-01-25 DIAGNOSIS — Z87891 Personal history of nicotine dependence: Secondary | ICD-10-CM | POA: Diagnosis not present

## 2021-01-25 DIAGNOSIS — F419 Anxiety disorder, unspecified: Secondary | ICD-10-CM | POA: Diagnosis not present

## 2021-01-25 DIAGNOSIS — Z7982 Long term (current) use of aspirin: Secondary | ICD-10-CM | POA: Diagnosis not present

## 2021-01-25 DIAGNOSIS — J9621 Acute and chronic respiratory failure with hypoxia: Secondary | ICD-10-CM | POA: Diagnosis not present

## 2021-01-25 DIAGNOSIS — J439 Emphysema, unspecified: Secondary | ICD-10-CM | POA: Diagnosis not present

## 2021-01-26 DIAGNOSIS — I10 Essential (primary) hypertension: Secondary | ICD-10-CM | POA: Diagnosis not present

## 2021-01-26 DIAGNOSIS — U099 Post covid-19 condition, unspecified: Secondary | ICD-10-CM | POA: Diagnosis not present

## 2021-01-26 DIAGNOSIS — Z9981 Dependence on supplemental oxygen: Secondary | ICD-10-CM | POA: Diagnosis not present

## 2021-01-26 DIAGNOSIS — Z7982 Long term (current) use of aspirin: Secondary | ICD-10-CM | POA: Diagnosis not present

## 2021-01-26 DIAGNOSIS — J9621 Acute and chronic respiratory failure with hypoxia: Secondary | ICD-10-CM | POA: Diagnosis not present

## 2021-01-26 DIAGNOSIS — Z87891 Personal history of nicotine dependence: Secondary | ICD-10-CM | POA: Diagnosis not present

## 2021-01-26 DIAGNOSIS — J439 Emphysema, unspecified: Secondary | ICD-10-CM | POA: Diagnosis not present

## 2021-01-26 DIAGNOSIS — F419 Anxiety disorder, unspecified: Secondary | ICD-10-CM | POA: Diagnosis not present

## 2021-01-29 DIAGNOSIS — U099 Post covid-19 condition, unspecified: Secondary | ICD-10-CM | POA: Diagnosis not present

## 2021-01-29 DIAGNOSIS — J449 Chronic obstructive pulmonary disease, unspecified: Secondary | ICD-10-CM | POA: Diagnosis not present

## 2021-01-30 DIAGNOSIS — F419 Anxiety disorder, unspecified: Secondary | ICD-10-CM | POA: Diagnosis not present

## 2021-01-30 DIAGNOSIS — J439 Emphysema, unspecified: Secondary | ICD-10-CM | POA: Diagnosis not present

## 2021-01-30 DIAGNOSIS — I10 Essential (primary) hypertension: Secondary | ICD-10-CM | POA: Diagnosis not present

## 2021-01-30 DIAGNOSIS — Z87891 Personal history of nicotine dependence: Secondary | ICD-10-CM | POA: Diagnosis not present

## 2021-01-30 DIAGNOSIS — Z7982 Long term (current) use of aspirin: Secondary | ICD-10-CM | POA: Diagnosis not present

## 2021-01-30 DIAGNOSIS — J9621 Acute and chronic respiratory failure with hypoxia: Secondary | ICD-10-CM | POA: Diagnosis not present

## 2021-01-30 DIAGNOSIS — Z9981 Dependence on supplemental oxygen: Secondary | ICD-10-CM | POA: Diagnosis not present

## 2021-01-30 DIAGNOSIS — U099 Post covid-19 condition, unspecified: Secondary | ICD-10-CM | POA: Diagnosis not present

## 2021-02-08 ENCOUNTER — Ambulatory Visit: Payer: Medicare HMO | Admitting: Pulmonary Disease

## 2021-02-09 DIAGNOSIS — Z87891 Personal history of nicotine dependence: Secondary | ICD-10-CM | POA: Diagnosis not present

## 2021-02-09 DIAGNOSIS — J9621 Acute and chronic respiratory failure with hypoxia: Secondary | ICD-10-CM | POA: Diagnosis not present

## 2021-02-09 DIAGNOSIS — J441 Chronic obstructive pulmonary disease with (acute) exacerbation: Secondary | ICD-10-CM | POA: Diagnosis not present

## 2021-02-09 DIAGNOSIS — U099 Post covid-19 condition, unspecified: Secondary | ICD-10-CM | POA: Diagnosis not present

## 2021-02-09 DIAGNOSIS — F419 Anxiety disorder, unspecified: Secondary | ICD-10-CM | POA: Diagnosis not present

## 2021-02-09 DIAGNOSIS — Z7982 Long term (current) use of aspirin: Secondary | ICD-10-CM | POA: Diagnosis not present

## 2021-02-09 DIAGNOSIS — I10 Essential (primary) hypertension: Secondary | ICD-10-CM | POA: Diagnosis not present

## 2021-02-09 DIAGNOSIS — Z9981 Dependence on supplemental oxygen: Secondary | ICD-10-CM | POA: Diagnosis not present

## 2021-02-09 DIAGNOSIS — J439 Emphysema, unspecified: Secondary | ICD-10-CM | POA: Diagnosis not present

## 2021-02-19 DIAGNOSIS — J441 Chronic obstructive pulmonary disease with (acute) exacerbation: Secondary | ICD-10-CM | POA: Diagnosis not present

## 2021-02-20 DIAGNOSIS — Z7982 Long term (current) use of aspirin: Secondary | ICD-10-CM | POA: Diagnosis not present

## 2021-02-20 DIAGNOSIS — U099 Post covid-19 condition, unspecified: Secondary | ICD-10-CM | POA: Diagnosis not present

## 2021-02-20 DIAGNOSIS — F419 Anxiety disorder, unspecified: Secondary | ICD-10-CM | POA: Diagnosis not present

## 2021-02-20 DIAGNOSIS — Z87891 Personal history of nicotine dependence: Secondary | ICD-10-CM | POA: Diagnosis not present

## 2021-02-20 DIAGNOSIS — Z9981 Dependence on supplemental oxygen: Secondary | ICD-10-CM | POA: Diagnosis not present

## 2021-02-20 DIAGNOSIS — J9621 Acute and chronic respiratory failure with hypoxia: Secondary | ICD-10-CM | POA: Diagnosis not present

## 2021-02-20 DIAGNOSIS — I10 Essential (primary) hypertension: Secondary | ICD-10-CM | POA: Diagnosis not present

## 2021-02-20 DIAGNOSIS — J439 Emphysema, unspecified: Secondary | ICD-10-CM | POA: Diagnosis not present

## 2021-03-11 DIAGNOSIS — J441 Chronic obstructive pulmonary disease with (acute) exacerbation: Secondary | ICD-10-CM | POA: Diagnosis not present

## 2021-03-15 ENCOUNTER — Ambulatory Visit: Payer: Medicare HMO | Admitting: Pulmonary Disease

## 2021-03-21 DIAGNOSIS — J441 Chronic obstructive pulmonary disease with (acute) exacerbation: Secondary | ICD-10-CM | POA: Diagnosis not present

## 2021-04-05 ENCOUNTER — Ambulatory Visit: Payer: Medicare HMO | Admitting: Pulmonary Disease

## 2021-04-11 DIAGNOSIS — J441 Chronic obstructive pulmonary disease with (acute) exacerbation: Secondary | ICD-10-CM | POA: Diagnosis not present

## 2021-04-21 DIAGNOSIS — J441 Chronic obstructive pulmonary disease with (acute) exacerbation: Secondary | ICD-10-CM | POA: Diagnosis not present

## 2021-05-11 DIAGNOSIS — J441 Chronic obstructive pulmonary disease with (acute) exacerbation: Secondary | ICD-10-CM | POA: Diagnosis not present

## 2021-05-21 DIAGNOSIS — J441 Chronic obstructive pulmonary disease with (acute) exacerbation: Secondary | ICD-10-CM | POA: Diagnosis not present

## 2021-06-11 DIAGNOSIS — J441 Chronic obstructive pulmonary disease with (acute) exacerbation: Secondary | ICD-10-CM | POA: Diagnosis not present

## 2021-06-21 DIAGNOSIS — J441 Chronic obstructive pulmonary disease with (acute) exacerbation: Secondary | ICD-10-CM | POA: Diagnosis not present

## 2021-06-26 IMAGING — CT CT CHEST WITHOUT CONTRAST
2 of 3 series · 15 of 36 positions shown, 18 images · non-contrast
Comparison: 01/14/2018

CLINICAL DATA: Chronic shortness of breath, history of emphysema

EXAM:
CT CHEST WITHOUT CONTRAST
TECHNIQUE: Multidetector CT imaging of the chest was performed following the
standard protocol without IV contrast.

[Series 2: thorax · axial · 0.70mm/px · z∈[-320,-40]mm · 12 of 166 slices shown, 15 images]
[im 13/166  mediastinal]
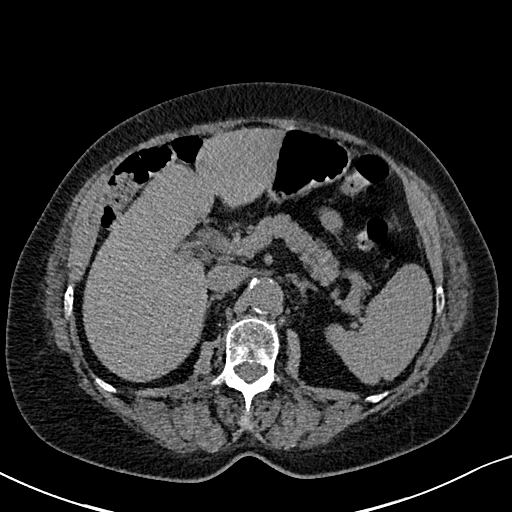
[im 13/166  lung]
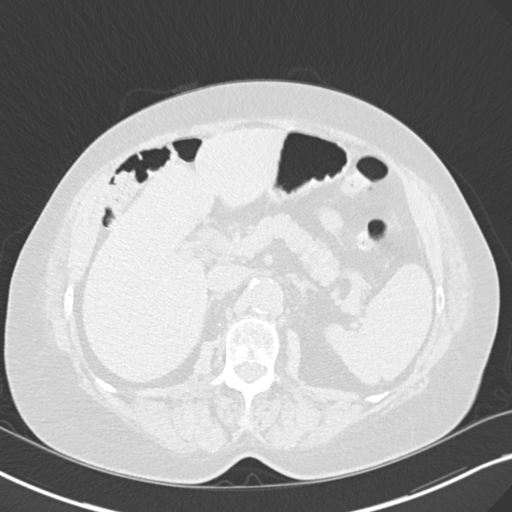
[im 25/166  lung]
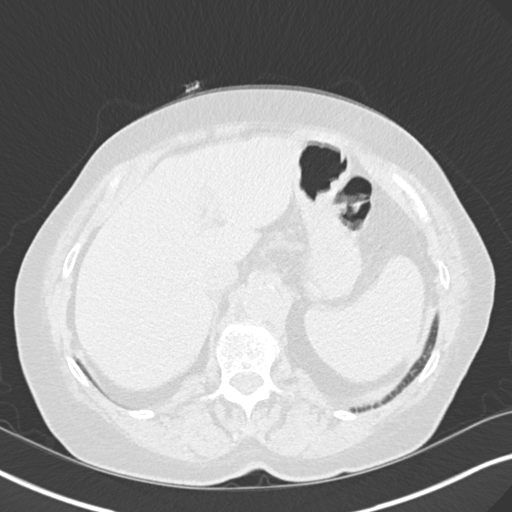
[im 37/166  lung]
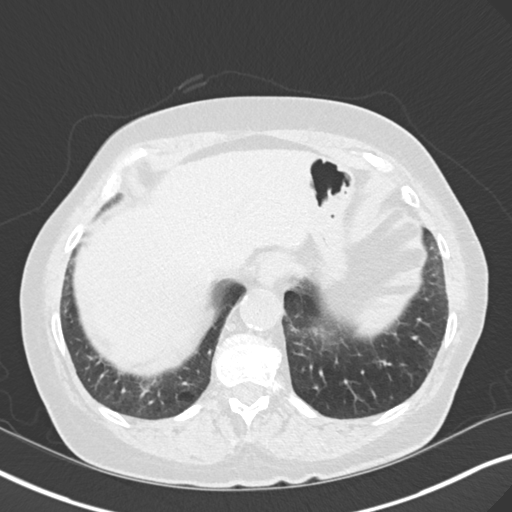
[im 49/166  lung]
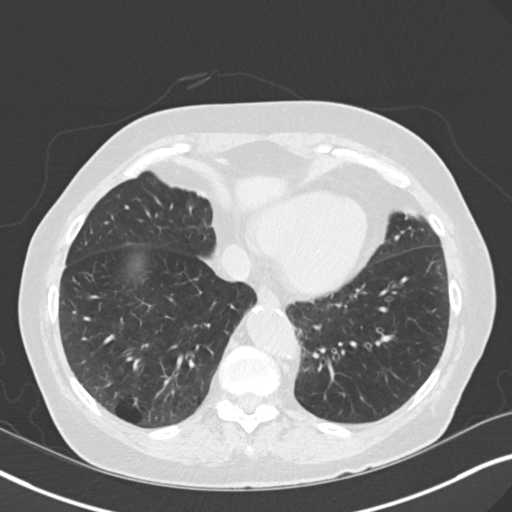
[im 62/166  mediastinal]
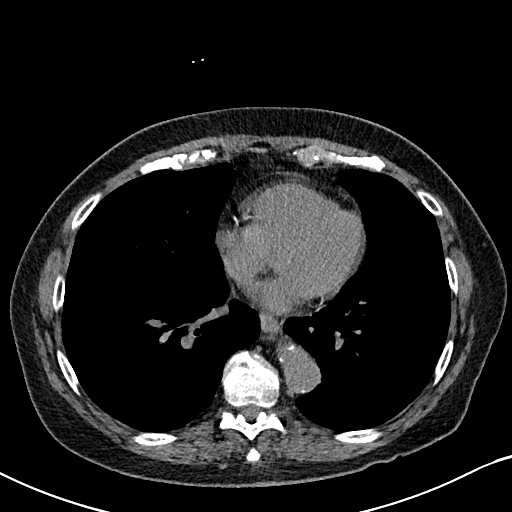
[im 62/166  lung]
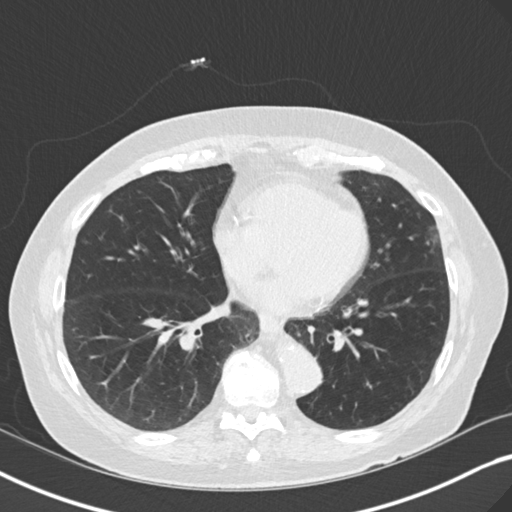
[im 74/166  lung]
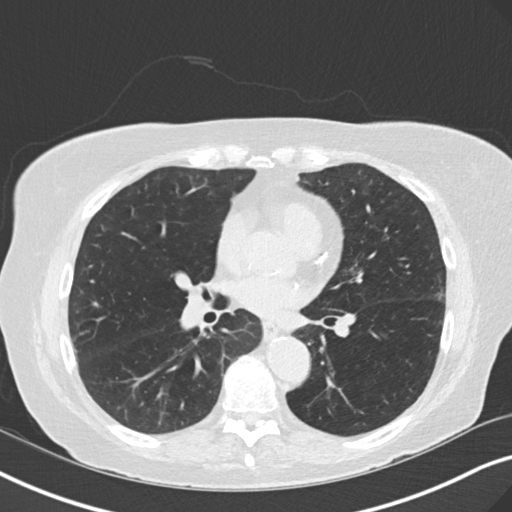
[im 92/166  lung]
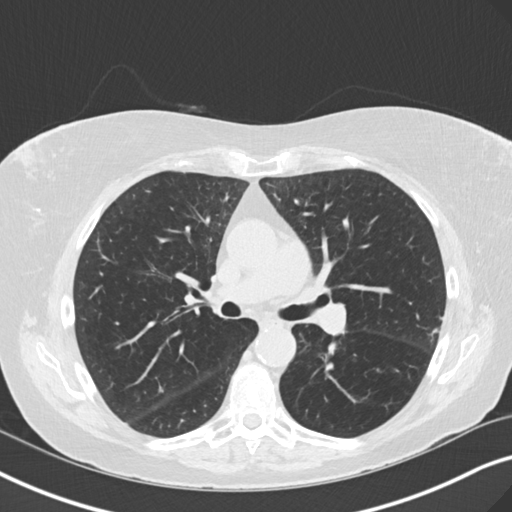
[im 104/166  lung]
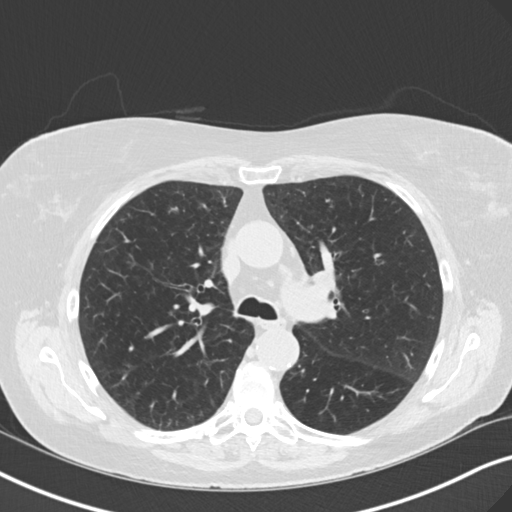
[im 117/166  mediastinal]
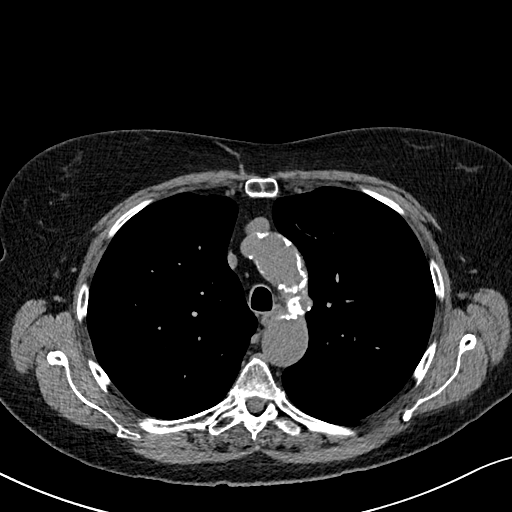
[im 117/166  lung]
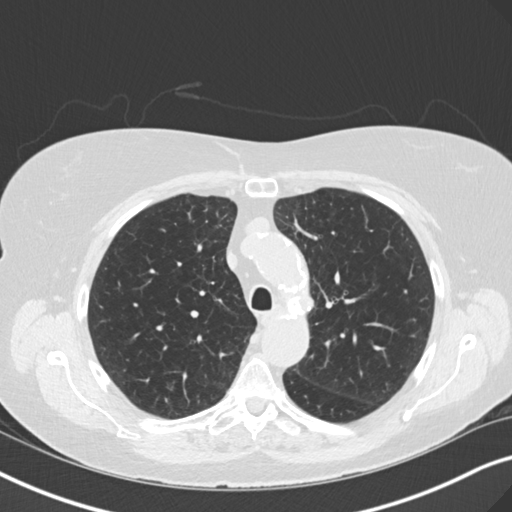
[im 129/166  lung]
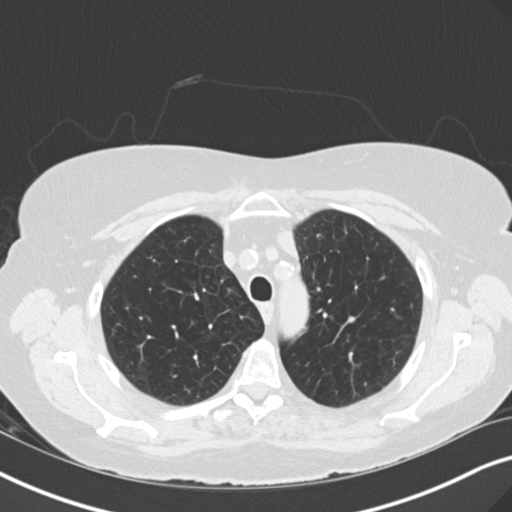
[im 141/166  lung]
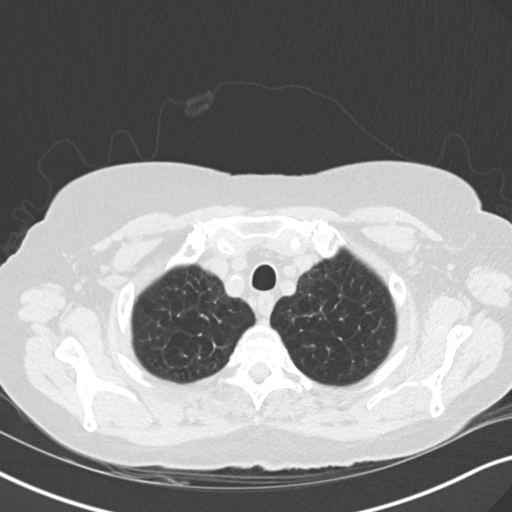
[im 153/166  lung]
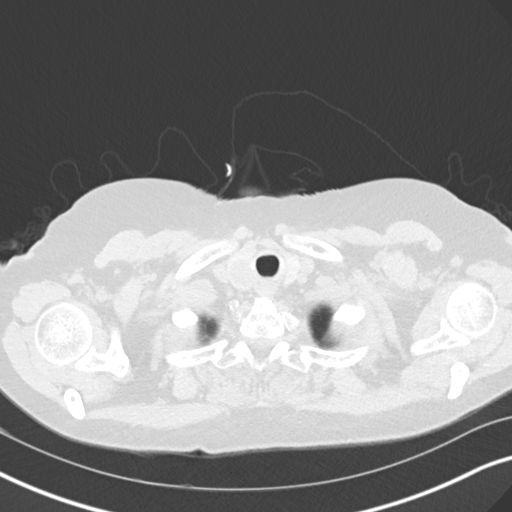

[Series 5: coronal · coronal · 0.65mm/px · 3 of 121 slices shown]
[im 25/121  lung]
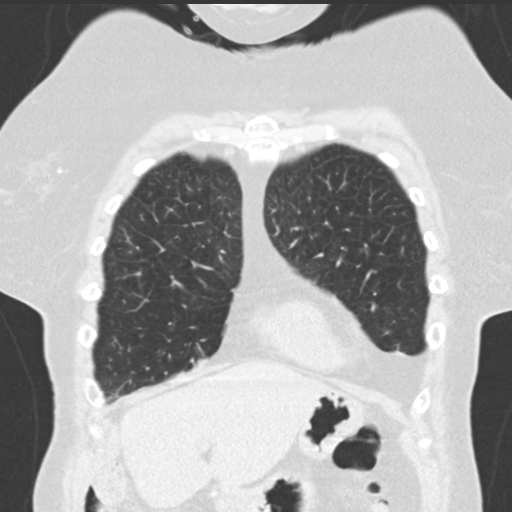
[im 49/121  lung]
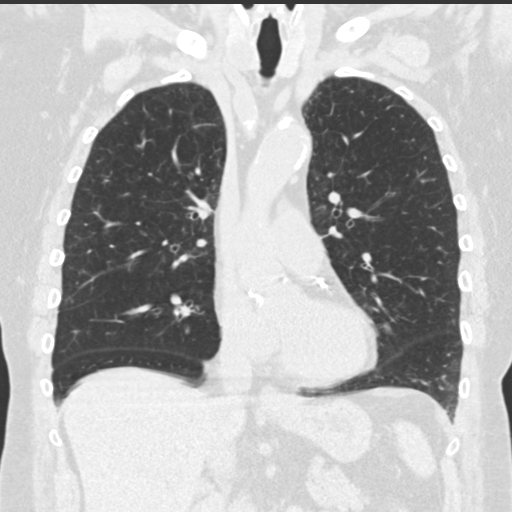
[im 73/121  lung]
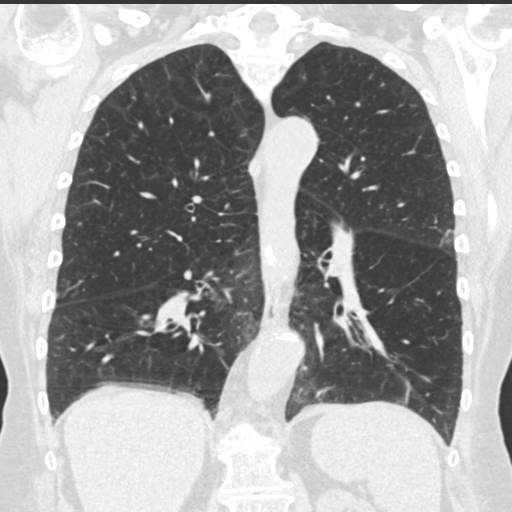

[15 of 36 positions shown; findings below may reference images not displayed]

FINDINGS: Cardiovascular: Limited due to lack of IV contrast. Aortic
atherosclerotic calcifications are identified without aneurysmal
dilatation. Coronary calcifications are seen as well. No cardiac
enlargement is noted. No pericardial fluid is seen.

Mediastinum/Nodes: Thoracic inlet demonstrates stable hypodensity
within the right lobe of the thyroid. No hilar or mediastinal
adenopathy is identified. The esophagus as visualized is within
normal limits.

Lungs/Pleura: Emphysematous changes are again identified. Some
lingular scarring is again seen and stable. Tiny 2 mm nodule is
noted in the medial aspect of left upper lobe best seen on image
number 29 of series 3. This was not noted on the prior exam. No
other sizable nodules are seen.

Upper Abdomen: Visualized upper abdomen is within normal limits.

Musculoskeletal: No chest wall mass or suspicious bone lesions
identified.
IMPRESSION: Tiny left upper lobe nodule. No follow-up needed if patient is
low-risk. Non-contrast chest CT can be considered in 12 months if
patient is high-risk. This recommendation follows the consensus
statement: Guidelines for Management of Incidental Pulmonary Nodules
Detected on CT Images: From the [HOSPITAL] 6380; Radiology
6380; [DATE].

Moderate emphysema stable from the previous exam.

Stable right thyroid nodule

Aortic Atherosclerosis (SC7DC-7G9.9) and Emphysema (SC7DC-G9Q.N).

## 2021-07-12 DIAGNOSIS — J441 Chronic obstructive pulmonary disease with (acute) exacerbation: Secondary | ICD-10-CM | POA: Diagnosis not present

## 2021-07-22 DIAGNOSIS — J441 Chronic obstructive pulmonary disease with (acute) exacerbation: Secondary | ICD-10-CM | POA: Diagnosis not present

## 2021-08-10 DIAGNOSIS — I10 Essential (primary) hypertension: Secondary | ICD-10-CM | POA: Diagnosis not present

## 2021-08-10 DIAGNOSIS — J449 Chronic obstructive pulmonary disease, unspecified: Secondary | ICD-10-CM | POA: Diagnosis not present

## 2021-08-11 DIAGNOSIS — J441 Chronic obstructive pulmonary disease with (acute) exacerbation: Secondary | ICD-10-CM | POA: Diagnosis not present

## 2021-08-21 DIAGNOSIS — J441 Chronic obstructive pulmonary disease with (acute) exacerbation: Secondary | ICD-10-CM | POA: Diagnosis not present

## 2021-09-11 DIAGNOSIS — J441 Chronic obstructive pulmonary disease with (acute) exacerbation: Secondary | ICD-10-CM | POA: Diagnosis not present

## 2021-09-21 DIAGNOSIS — J441 Chronic obstructive pulmonary disease with (acute) exacerbation: Secondary | ICD-10-CM | POA: Diagnosis not present

## 2021-10-11 DIAGNOSIS — J441 Chronic obstructive pulmonary disease with (acute) exacerbation: Secondary | ICD-10-CM | POA: Diagnosis not present

## 2021-11-11 DIAGNOSIS — J441 Chronic obstructive pulmonary disease with (acute) exacerbation: Secondary | ICD-10-CM | POA: Diagnosis not present

## 2021-11-21 DIAGNOSIS — J441 Chronic obstructive pulmonary disease with (acute) exacerbation: Secondary | ICD-10-CM | POA: Diagnosis not present

## 2021-12-12 DIAGNOSIS — J441 Chronic obstructive pulmonary disease with (acute) exacerbation: Secondary | ICD-10-CM | POA: Diagnosis not present

## 2021-12-22 DIAGNOSIS — J441 Chronic obstructive pulmonary disease with (acute) exacerbation: Secondary | ICD-10-CM | POA: Diagnosis not present

## 2021-12-26 ENCOUNTER — Telehealth: Payer: Self-pay | Admitting: Pulmonary Disease

## 2021-12-26 DIAGNOSIS — J449 Chronic obstructive pulmonary disease, unspecified: Secondary | ICD-10-CM | POA: Diagnosis not present

## 2021-12-27 NOTE — Telephone Encounter (Signed)
Called and spoke to patient in regards to her ventolin inhaler. Patient states her PCP has already refilled meds. Patient was schedules for an appt in may with BW. Nothing further needed.

## 2022-01-09 DIAGNOSIS — J441 Chronic obstructive pulmonary disease with (acute) exacerbation: Secondary | ICD-10-CM | POA: Diagnosis not present

## 2022-01-19 DIAGNOSIS — J441 Chronic obstructive pulmonary disease with (acute) exacerbation: Secondary | ICD-10-CM | POA: Diagnosis not present

## 2022-02-09 DIAGNOSIS — J441 Chronic obstructive pulmonary disease with (acute) exacerbation: Secondary | ICD-10-CM | POA: Diagnosis not present

## 2022-02-19 DIAGNOSIS — J441 Chronic obstructive pulmonary disease with (acute) exacerbation: Secondary | ICD-10-CM | POA: Diagnosis not present

## 2022-03-11 DIAGNOSIS — J441 Chronic obstructive pulmonary disease with (acute) exacerbation: Secondary | ICD-10-CM | POA: Diagnosis not present

## 2022-03-21 DIAGNOSIS — J441 Chronic obstructive pulmonary disease with (acute) exacerbation: Secondary | ICD-10-CM | POA: Diagnosis not present

## 2022-03-26 ENCOUNTER — Ambulatory Visit: Payer: Medicare HMO | Admitting: Primary Care

## 2022-04-10 DIAGNOSIS — I10 Essential (primary) hypertension: Secondary | ICD-10-CM | POA: Diagnosis not present

## 2022-04-10 DIAGNOSIS — J302 Other seasonal allergic rhinitis: Secondary | ICD-10-CM | POA: Diagnosis not present

## 2022-04-10 DIAGNOSIS — Z0000 Encounter for general adult medical examination without abnormal findings: Secondary | ICD-10-CM | POA: Diagnosis not present

## 2022-04-10 DIAGNOSIS — J9611 Chronic respiratory failure with hypoxia: Secondary | ICD-10-CM | POA: Diagnosis not present

## 2022-04-10 DIAGNOSIS — J449 Chronic obstructive pulmonary disease, unspecified: Secondary | ICD-10-CM | POA: Diagnosis not present

## 2022-04-10 DIAGNOSIS — Z1331 Encounter for screening for depression: Secondary | ICD-10-CM | POA: Diagnosis not present

## 2022-04-10 DIAGNOSIS — Z6826 Body mass index (BMI) 26.0-26.9, adult: Secondary | ICD-10-CM | POA: Diagnosis not present

## 2022-04-11 DIAGNOSIS — J441 Chronic obstructive pulmonary disease with (acute) exacerbation: Secondary | ICD-10-CM | POA: Diagnosis not present

## 2022-04-21 DIAGNOSIS — J441 Chronic obstructive pulmonary disease with (acute) exacerbation: Secondary | ICD-10-CM | POA: Diagnosis not present

## 2022-05-07 DIAGNOSIS — G4489 Other headache syndrome: Secondary | ICD-10-CM | POA: Diagnosis not present

## 2022-05-07 DIAGNOSIS — R0602 Shortness of breath: Secondary | ICD-10-CM | POA: Diagnosis not present

## 2022-05-07 DIAGNOSIS — R457 State of emotional shock and stress, unspecified: Secondary | ICD-10-CM | POA: Diagnosis not present

## 2022-05-07 DIAGNOSIS — I2510 Atherosclerotic heart disease of native coronary artery without angina pectoris: Secondary | ICD-10-CM | POA: Diagnosis not present

## 2022-05-07 DIAGNOSIS — R509 Fever, unspecified: Secondary | ICD-10-CM | POA: Diagnosis not present

## 2022-05-07 DIAGNOSIS — R519 Headache, unspecified: Secondary | ICD-10-CM | POA: Diagnosis not present

## 2022-05-07 DIAGNOSIS — Z20822 Contact with and (suspected) exposure to covid-19: Secondary | ICD-10-CM | POA: Diagnosis not present

## 2022-05-07 DIAGNOSIS — R000 Tachycardia, unspecified: Secondary | ICD-10-CM | POA: Diagnosis not present

## 2022-05-08 DIAGNOSIS — R0602 Shortness of breath: Secondary | ICD-10-CM | POA: Diagnosis not present

## 2022-05-08 DIAGNOSIS — Z743 Need for continuous supervision: Secondary | ICD-10-CM | POA: Diagnosis not present

## 2022-05-08 DIAGNOSIS — I10 Essential (primary) hypertension: Secondary | ICD-10-CM | POA: Diagnosis not present

## 2022-05-11 DIAGNOSIS — J441 Chronic obstructive pulmonary disease with (acute) exacerbation: Secondary | ICD-10-CM | POA: Diagnosis not present

## 2022-05-17 DIAGNOSIS — J9811 Atelectasis: Secondary | ICD-10-CM | POA: Diagnosis not present

## 2022-05-17 DIAGNOSIS — R000 Tachycardia, unspecified: Secondary | ICD-10-CM | POA: Diagnosis not present

## 2022-05-17 DIAGNOSIS — R069 Unspecified abnormalities of breathing: Secondary | ICD-10-CM | POA: Diagnosis not present

## 2022-05-17 DIAGNOSIS — J441 Chronic obstructive pulmonary disease with (acute) exacerbation: Secondary | ICD-10-CM | POA: Diagnosis not present

## 2022-06-20 DIAGNOSIS — R457 State of emotional shock and stress, unspecified: Secondary | ICD-10-CM | POA: Diagnosis not present

## 2022-06-20 DIAGNOSIS — R0602 Shortness of breath: Secondary | ICD-10-CM | POA: Diagnosis not present

## 2022-06-20 DIAGNOSIS — Z87891 Personal history of nicotine dependence: Secondary | ICD-10-CM | POA: Diagnosis not present

## 2022-06-20 DIAGNOSIS — R0902 Hypoxemia: Secondary | ICD-10-CM | POA: Diagnosis not present

## 2022-06-20 DIAGNOSIS — Z743 Need for continuous supervision: Secondary | ICD-10-CM | POA: Diagnosis not present

## 2022-06-20 DIAGNOSIS — J449 Chronic obstructive pulmonary disease, unspecified: Secondary | ICD-10-CM | POA: Diagnosis not present

## 2022-06-20 DIAGNOSIS — R000 Tachycardia, unspecified: Secondary | ICD-10-CM | POA: Diagnosis not present

## 2022-06-20 DIAGNOSIS — J441 Chronic obstructive pulmonary disease with (acute) exacerbation: Secondary | ICD-10-CM | POA: Diagnosis not present

## 2022-08-06 DIAGNOSIS — G4489 Other headache syndrome: Secondary | ICD-10-CM | POA: Diagnosis not present

## 2022-08-06 DIAGNOSIS — R0602 Shortness of breath: Secondary | ICD-10-CM | POA: Diagnosis not present

## 2022-08-06 DIAGNOSIS — K219 Gastro-esophageal reflux disease without esophagitis: Secondary | ICD-10-CM | POA: Diagnosis not present

## 2022-08-06 DIAGNOSIS — R000 Tachycardia, unspecified: Secondary | ICD-10-CM | POA: Diagnosis not present

## 2022-08-06 DIAGNOSIS — Z87891 Personal history of nicotine dependence: Secondary | ICD-10-CM | POA: Diagnosis not present

## 2022-08-06 DIAGNOSIS — I10 Essential (primary) hypertension: Secondary | ICD-10-CM | POA: Diagnosis not present

## 2022-08-06 DIAGNOSIS — J209 Acute bronchitis, unspecified: Secondary | ICD-10-CM | POA: Diagnosis not present

## 2022-08-06 DIAGNOSIS — J439 Emphysema, unspecified: Secondary | ICD-10-CM | POA: Diagnosis not present

## 2022-08-06 DIAGNOSIS — R519 Headache, unspecified: Secondary | ICD-10-CM | POA: Diagnosis not present

## 2022-08-06 DIAGNOSIS — J441 Chronic obstructive pulmonary disease with (acute) exacerbation: Secondary | ICD-10-CM | POA: Diagnosis not present

## 2022-08-07 DIAGNOSIS — R0902 Hypoxemia: Secondary | ICD-10-CM | POA: Diagnosis not present

## 2022-08-07 DIAGNOSIS — Z743 Need for continuous supervision: Secondary | ICD-10-CM | POA: Diagnosis not present

## 2022-08-14 DIAGNOSIS — F410 Panic disorder [episodic paroxysmal anxiety] without agoraphobia: Secondary | ICD-10-CM | POA: Diagnosis not present

## 2022-09-10 DIAGNOSIS — H5203 Hypermetropia, bilateral: Secondary | ICD-10-CM | POA: Diagnosis not present

## 2022-10-11 ENCOUNTER — Ambulatory Visit: Payer: Medicare HMO | Admitting: Internal Medicine

## 2022-10-21 ENCOUNTER — Other Ambulatory Visit: Payer: Self-pay

## 2022-10-21 MED ORDER — ALBUTEROL SULFATE HFA 108 (90 BASE) MCG/ACT IN AERS: 1.0000 | INHALATION_SPRAY | Freq: Four times a day (QID) | RESPIRATORY_TRACT | 3 refills | Status: DC | PRN

## 2022-11-29 ENCOUNTER — Other Ambulatory Visit: Payer: Self-pay

## 2022-11-29 MED ORDER — ALBUTEROL SULFATE HFA 108 (90 BASE) MCG/ACT IN AERS: 1.0000 | INHALATION_SPRAY | Freq: Four times a day (QID) | RESPIRATORY_TRACT | 3 refills | Status: DC | PRN

## 2022-12-12 ENCOUNTER — Telehealth: Payer: Self-pay

## 2022-12-12 NOTE — Progress Notes (Signed)
Patient is scheduled for CMCS appointment with Sushama on 12/13/2022@10 :30am

## 2022-12-13 ENCOUNTER — Ambulatory Visit: Payer: Medicare HMO

## 2022-12-18 ENCOUNTER — Other Ambulatory Visit: Payer: Self-pay

## 2022-12-23 ENCOUNTER — Telehealth: Payer: Medicare HMO | Admitting: Internal Medicine

## 2022-12-23 ENCOUNTER — Encounter: Payer: Self-pay | Admitting: Internal Medicine

## 2022-12-23 DIAGNOSIS — F410 Panic disorder [episodic paroxysmal anxiety] without agoraphobia: Secondary | ICD-10-CM | POA: Insufficient documentation

## 2022-12-23 MED ORDER — ALPRAZOLAM 0.5 MG PO TABS: 0.5000 mg | ORAL_TABLET | Freq: Every day | ORAL | 0 refills | Status: AC | PRN

## 2022-12-23 NOTE — Progress Notes (Signed)
Office Visit  Subjective   Patient ID: Andrea Collins   DOB: May 09, 1951   Age: 72 y.o.   MRN: [id number]   Chief Complaint No chief complaint on file.    History of Present Illness Andrea Collins is a 72 yo female who returns today for panic disorder.  I did a telehealth with her in 08/2022 where she was having panic attacks.  She had called 911 twice in the last month where she had breathing problems.  She was taken to the ER on 08/06/2022 where they felt she had a COPD exacerbation and bronchitis.  She tells me she does not have anxiousness but will panic when she states she cannot breath and this is when she called 911.  Otherwise she is not taking any meds for her history of anxiety.  She denies any depression.  She used to be on Xanax 0.73m po daily as needed.   We restarted her on xanax 0.51m1/2 to 1 tab po daily as needed for panic attacks.  She has not had to use a xanax in "a while".  She is not having problems with insomnia at this time.  She denies difficulty concentrating, difficulty performing routine daily activities, fatigue, extreme feelings of guilt, feelings of isolation, feelings of worthlessness, helpless feeling, suicidal ideation, homicidal ideation, and panic attacks. This patient feels that she is able to care for herself. She currently lives with her husband. She has no significant prior history of mental health disorders.          Past Medical History Past Medical History:  Diagnosis Date   COPD (chronic obstructive pulmonary disease) (HCC)    Essential hypertension    GAD (generalized anxiety disorder)    Seasonal allergies    Tobacco abuse      Allergies No Known Allergies   Medications  Current Outpatient Medications:    albuterol (VENTOLIN HFA) 108 (90 Base) MCG/ACT inhaler, Inhale 1-2 puffs into the lungs every 6 (six) hours as needed for wheezing or shortness of breath., Disp: 1 each, Rfl: 3   ALPRAZolam (XANAX) 0.5 MG tablet, Take 1 tablet (0.5 mg  total) by mouth daily as needed for anxiety., Disp: 30 tablet, Rfl: 0   benzonatate (TESSALON) 100 MG capsule, Take by mouth 3 (three) times daily as needed for cough., Disp: , Rfl:    bisoprolol (ZEBETA) 5 MG tablet, Take 5 mg by mouth daily., Disp: , Rfl:    chlorthalidone (HYGROTON) 25 MG tablet, Take 25 mg by mouth daily. Take 1/2 tab, Disp: , Rfl:    cholecalciferol (VITAMIN D3) 25 MCG (1000 UT) tablet, Take 1,000 Units by mouth daily., Disp: , Rfl:    fluticasone (FLONASE) 50 MCG/ACT nasal spray, Place 1 spray into both nostrils daily., Disp: , Rfl:    ipratropium-albuterol (DUONEB) 0.5-2.5 (3) MG/3ML SOLN, Take 3 mLs by nebulization every 4 (four) hours as needed., Disp: , Rfl:    tiotropium (SPIRIVA) 18 MCG inhalation capsule, Place 1 capsule (18 mcg total) into inhaler and inhale daily., Disp: 30 capsule, Rfl: 3   Review of Systems Review of Systems  Constitutional:  Negative for chills and fever.  Respiratory:  Positive for shortness of breath. Negative for cough and sputum production.   Cardiovascular:  Negative for chest pain, palpitations and leg swelling.  Gastrointestinal:  Negative for abdominal pain, constipation, diarrhea, nausea and vomiting.  Neurological:  Negative for dizziness, weakness and headaches.       Objective:    Vitals  There were no vitals taken for this visit.   Physical Examination Physical Exam Constitutional:      Appearance: Normal appearance.  Neurological:     Mental Status: She is alert.  Psychiatric:        Mood and Affect: Mood normal.        Behavior: Behavior normal.        Assessment & Plan:   Panic disorder She uses her xanax as needed.  She does not need a controller medicine at this time.    No follow-ups on file.   Townsend Roger, MD

## 2022-12-23 NOTE — Assessment & Plan Note (Signed)
She uses her xanax as needed.  She does not need a controller medicine at this time.

## 2022-12-27 ENCOUNTER — Ambulatory Visit: Payer: Medicare HMO

## 2023-01-10 DIAGNOSIS — Z8711 Personal history of peptic ulcer disease: Secondary | ICD-10-CM | POA: Diagnosis not present

## 2023-01-10 DIAGNOSIS — J449 Chronic obstructive pulmonary disease, unspecified: Secondary | ICD-10-CM | POA: Diagnosis not present

## 2023-01-10 DIAGNOSIS — R457 State of emotional shock and stress, unspecified: Secondary | ICD-10-CM | POA: Diagnosis not present

## 2023-01-10 DIAGNOSIS — J439 Emphysema, unspecified: Secondary | ICD-10-CM | POA: Diagnosis not present

## 2023-01-10 DIAGNOSIS — J90 Pleural effusion, not elsewhere classified: Secondary | ICD-10-CM | POA: Diagnosis not present

## 2023-01-10 DIAGNOSIS — F419 Anxiety disorder, unspecified: Secondary | ICD-10-CM | POA: Diagnosis not present

## 2023-01-10 DIAGNOSIS — R069 Unspecified abnormalities of breathing: Secondary | ICD-10-CM | POA: Diagnosis not present

## 2023-01-10 DIAGNOSIS — Z7952 Long term (current) use of systemic steroids: Secondary | ICD-10-CM | POA: Diagnosis not present

## 2023-01-10 DIAGNOSIS — R911 Solitary pulmonary nodule: Secondary | ICD-10-CM | POA: Diagnosis not present

## 2023-01-10 DIAGNOSIS — I10 Essential (primary) hypertension: Secondary | ICD-10-CM | POA: Diagnosis not present

## 2023-01-10 DIAGNOSIS — Z87891 Personal history of nicotine dependence: Secondary | ICD-10-CM | POA: Diagnosis not present

## 2023-01-10 DIAGNOSIS — M1990 Unspecified osteoarthritis, unspecified site: Secondary | ICD-10-CM | POA: Diagnosis not present

## 2023-01-10 DIAGNOSIS — R079 Chest pain, unspecified: Secondary | ICD-10-CM | POA: Diagnosis not present

## 2023-01-10 DIAGNOSIS — R000 Tachycardia, unspecified: Secondary | ICD-10-CM | POA: Diagnosis not present

## 2023-01-10 DIAGNOSIS — J441 Chronic obstructive pulmonary disease with (acute) exacerbation: Secondary | ICD-10-CM | POA: Diagnosis not present

## 2023-01-10 DIAGNOSIS — R0609 Other forms of dyspnea: Secondary | ICD-10-CM | POA: Diagnosis not present

## 2023-01-10 DIAGNOSIS — Z9981 Dependence on supplemental oxygen: Secondary | ICD-10-CM | POA: Diagnosis not present

## 2023-01-11 DIAGNOSIS — I10 Essential (primary) hypertension: Secondary | ICD-10-CM | POA: Diagnosis not present

## 2023-01-11 DIAGNOSIS — M1990 Unspecified osteoarthritis, unspecified site: Secondary | ICD-10-CM | POA: Diagnosis not present

## 2023-01-11 DIAGNOSIS — R0609 Other forms of dyspnea: Secondary | ICD-10-CM | POA: Diagnosis not present

## 2023-01-11 DIAGNOSIS — J441 Chronic obstructive pulmonary disease with (acute) exacerbation: Secondary | ICD-10-CM | POA: Diagnosis not present

## 2023-02-04 ENCOUNTER — Telehealth: Payer: Self-pay

## 2023-02-05 DIAGNOSIS — R911 Solitary pulmonary nodule: Secondary | ICD-10-CM | POA: Diagnosis not present

## 2023-02-05 DIAGNOSIS — R531 Weakness: Secondary | ICD-10-CM | POA: Diagnosis not present

## 2023-02-05 DIAGNOSIS — R069 Unspecified abnormalities of breathing: Secondary | ICD-10-CM | POA: Diagnosis not present

## 2023-02-05 DIAGNOSIS — J439 Emphysema, unspecified: Secondary | ICD-10-CM | POA: Diagnosis not present

## 2023-02-05 DIAGNOSIS — R457 State of emotional shock and stress, unspecified: Secondary | ICD-10-CM | POA: Diagnosis not present

## 2023-02-05 DIAGNOSIS — I10 Essential (primary) hypertension: Secondary | ICD-10-CM | POA: Diagnosis not present

## 2023-02-05 DIAGNOSIS — J441 Chronic obstructive pulmonary disease with (acute) exacerbation: Secondary | ICD-10-CM | POA: Diagnosis not present

## 2023-02-05 DIAGNOSIS — J9811 Atelectasis: Secondary | ICD-10-CM | POA: Diagnosis not present

## 2023-02-05 DIAGNOSIS — J449 Chronic obstructive pulmonary disease, unspecified: Secondary | ICD-10-CM | POA: Diagnosis not present

## 2023-02-05 DIAGNOSIS — Z743 Need for continuous supervision: Secondary | ICD-10-CM | POA: Diagnosis not present

## 2023-02-06 NOTE — Telephone Encounter (Signed)
I received a call from [facility] at [facility] regarding our patient. She was concerned that the patient might need to be evaluated due to the multiple calls they receive from her regarding her oxygen. According to their records the patient has contacted them 17 times since Sept 2023. Each time the patient has the same complaint. She states that her oxygen is not working or it is blowing out cold air. Per Hassan Rowan, the patients O2 concentrator has been replaced several times just to please the patient. Hassan Rowan also stated that they received a call from the patient on 02/03/2023 stating that her concentrator was not working. They sent a Tech out to her house that evening, well the next morning the patient called [facility] again upset saying that nobody ever showed up. They had the tech to call the patient and only then did she remember that he had came out to her house.  I spoke with the patients son, he feels that his mom is having a lot of anxiety and would like for her to be seen in office. [name] and she confirmed that she does have anxiety but doesn't think that it is anything she needs to be seen for.

## 2023-02-10 ENCOUNTER — Telehealth: Payer: Self-pay

## 2023-02-10 NOTE — Telephone Encounter (Signed)
        Patient  visited [place] on 3/27     Telephone encounter attempt :  1st  A HIPAA compliant voice message was left requesting a return call.  Instructed patient to call back    Bruin Bolger [facility] Guide, [place] [phone] [address], [place], [place] [address] Phone: [phone] Email: [email or web address]       

## 2023-02-11 ENCOUNTER — Telehealth: Payer: Self-pay

## 2023-02-11 NOTE — Telephone Encounter (Signed)
        Patient  visited [place] on 3/27    Telephone encounter attempt :  2nd  A HIPAA compliant voice message was left requesting a return call.  Instructed patient to call back .    [place] [phone] [address], [place], [place] [address] Phone: [phone] Email: [email or web address]

## 2023-02-13 DIAGNOSIS — R0902 Hypoxemia: Secondary | ICD-10-CM | POA: Diagnosis not present

## 2023-02-13 DIAGNOSIS — R000 Tachycardia, unspecified: Secondary | ICD-10-CM | POA: Diagnosis not present

## 2023-02-13 DIAGNOSIS — J449 Chronic obstructive pulmonary disease, unspecified: Secondary | ICD-10-CM | POA: Diagnosis not present

## 2023-02-13 DIAGNOSIS — I10 Essential (primary) hypertension: Secondary | ICD-10-CM | POA: Diagnosis not present

## 2023-02-13 DIAGNOSIS — M7989 Other specified soft tissue disorders: Secondary | ICD-10-CM | POA: Diagnosis not present

## 2023-02-13 DIAGNOSIS — J9811 Atelectasis: Secondary | ICD-10-CM | POA: Diagnosis not present

## 2023-02-13 DIAGNOSIS — M79662 Pain in left lower leg: Secondary | ICD-10-CM | POA: Diagnosis not present

## 2023-02-13 DIAGNOSIS — J441 Chronic obstructive pulmonary disease with (acute) exacerbation: Secondary | ICD-10-CM | POA: Diagnosis not present

## 2023-02-13 DIAGNOSIS — J439 Emphysema, unspecified: Secondary | ICD-10-CM | POA: Diagnosis not present

## 2023-02-14 DIAGNOSIS — R000 Tachycardia, unspecified: Secondary | ICD-10-CM | POA: Diagnosis not present

## 2023-02-14 DIAGNOSIS — R531 Weakness: Secondary | ICD-10-CM | POA: Diagnosis not present

## 2023-02-14 DIAGNOSIS — K219 Gastro-esophageal reflux disease without esophagitis: Secondary | ICD-10-CM | POA: Diagnosis not present

## 2023-02-14 DIAGNOSIS — Z7401 Bed confinement status: Secondary | ICD-10-CM | POA: Diagnosis not present

## 2023-02-14 DIAGNOSIS — R0902 Hypoxemia: Secondary | ICD-10-CM | POA: Diagnosis not present

## 2023-02-14 DIAGNOSIS — M79662 Pain in left lower leg: Secondary | ICD-10-CM | POA: Diagnosis not present

## 2023-02-14 DIAGNOSIS — M1990 Unspecified osteoarthritis, unspecified site: Secondary | ICD-10-CM | POA: Diagnosis not present

## 2023-02-14 DIAGNOSIS — I10 Essential (primary) hypertension: Secondary | ICD-10-CM | POA: Diagnosis not present

## 2023-02-14 DIAGNOSIS — J9811 Atelectasis: Secondary | ICD-10-CM | POA: Diagnosis not present

## 2023-02-14 DIAGNOSIS — D509 Iron deficiency anemia, unspecified: Secondary | ICD-10-CM | POA: Diagnosis not present

## 2023-02-14 DIAGNOSIS — J449 Chronic obstructive pulmonary disease, unspecified: Secondary | ICD-10-CM | POA: Diagnosis not present

## 2023-02-14 DIAGNOSIS — F419 Anxiety disorder, unspecified: Secondary | ICD-10-CM | POA: Diagnosis not present

## 2023-02-14 DIAGNOSIS — M7989 Other specified soft tissue disorders: Secondary | ICD-10-CM | POA: Diagnosis not present

## 2023-02-14 DIAGNOSIS — J441 Chronic obstructive pulmonary disease with (acute) exacerbation: Secondary | ICD-10-CM | POA: Diagnosis not present

## 2023-02-14 DIAGNOSIS — E538 Deficiency of other specified B group vitamins: Secondary | ICD-10-CM | POA: Diagnosis not present

## 2023-02-14 DIAGNOSIS — F03A0 Unspecified dementia, mild, without behavioral disturbance, psychotic disturbance, mood disturbance, and anxiety: Secondary | ICD-10-CM | POA: Diagnosis not present

## 2023-02-14 DIAGNOSIS — J9600 Acute respiratory failure, unspecified whether with hypoxia or hypercapnia: Secondary | ICD-10-CM | POA: Diagnosis not present

## 2023-02-14 DIAGNOSIS — J439 Emphysema, unspecified: Secondary | ICD-10-CM | POA: Diagnosis not present

## 2023-02-19 DIAGNOSIS — F410 Panic disorder [episodic paroxysmal anxiety] without agoraphobia: Secondary | ICD-10-CM | POA: Diagnosis not present

## 2023-02-19 DIAGNOSIS — I700 Atherosclerosis of aorta: Secondary | ICD-10-CM | POA: Diagnosis not present

## 2023-02-19 DIAGNOSIS — J302 Other seasonal allergic rhinitis: Secondary | ICD-10-CM | POA: Diagnosis not present

## 2023-02-19 DIAGNOSIS — J441 Chronic obstructive pulmonary disease with (acute) exacerbation: Secondary | ICD-10-CM | POA: Diagnosis not present

## 2023-02-19 DIAGNOSIS — J9811 Atelectasis: Secondary | ICD-10-CM | POA: Diagnosis not present

## 2023-02-19 DIAGNOSIS — E785 Hyperlipidemia, unspecified: Secondary | ICD-10-CM | POA: Diagnosis not present

## 2023-02-19 DIAGNOSIS — J439 Emphysema, unspecified: Secondary | ICD-10-CM | POA: Diagnosis not present

## 2023-02-19 DIAGNOSIS — F411 Generalized anxiety disorder: Secondary | ICD-10-CM | POA: Diagnosis not present

## 2023-02-19 DIAGNOSIS — I10 Essential (primary) hypertension: Secondary | ICD-10-CM | POA: Diagnosis not present

## 2023-02-20 DIAGNOSIS — E785 Hyperlipidemia, unspecified: Secondary | ICD-10-CM | POA: Diagnosis not present

## 2023-02-20 DIAGNOSIS — F410 Panic disorder [episodic paroxysmal anxiety] without agoraphobia: Secondary | ICD-10-CM | POA: Diagnosis not present

## 2023-02-20 DIAGNOSIS — J441 Chronic obstructive pulmonary disease with (acute) exacerbation: Secondary | ICD-10-CM | POA: Diagnosis not present

## 2023-02-20 DIAGNOSIS — I700 Atherosclerosis of aorta: Secondary | ICD-10-CM | POA: Diagnosis not present

## 2023-02-20 DIAGNOSIS — I10 Essential (primary) hypertension: Secondary | ICD-10-CM | POA: Diagnosis not present

## 2023-02-20 DIAGNOSIS — J9811 Atelectasis: Secondary | ICD-10-CM | POA: Diagnosis not present

## 2023-02-20 DIAGNOSIS — F411 Generalized anxiety disorder: Secondary | ICD-10-CM | POA: Diagnosis not present

## 2023-02-20 DIAGNOSIS — J302 Other seasonal allergic rhinitis: Secondary | ICD-10-CM | POA: Diagnosis not present

## 2023-02-20 DIAGNOSIS — J439 Emphysema, unspecified: Secondary | ICD-10-CM | POA: Diagnosis not present

## 2023-02-24 DIAGNOSIS — F410 Panic disorder [episodic paroxysmal anxiety] without agoraphobia: Secondary | ICD-10-CM | POA: Diagnosis not present

## 2023-02-24 DIAGNOSIS — I10 Essential (primary) hypertension: Secondary | ICD-10-CM | POA: Diagnosis not present

## 2023-02-24 DIAGNOSIS — E785 Hyperlipidemia, unspecified: Secondary | ICD-10-CM | POA: Diagnosis not present

## 2023-02-24 DIAGNOSIS — J9811 Atelectasis: Secondary | ICD-10-CM | POA: Diagnosis not present

## 2023-02-24 DIAGNOSIS — J302 Other seasonal allergic rhinitis: Secondary | ICD-10-CM | POA: Diagnosis not present

## 2023-02-24 DIAGNOSIS — I700 Atherosclerosis of aorta: Secondary | ICD-10-CM | POA: Diagnosis not present

## 2023-02-24 DIAGNOSIS — F411 Generalized anxiety disorder: Secondary | ICD-10-CM | POA: Diagnosis not present

## 2023-02-24 DIAGNOSIS — J439 Emphysema, unspecified: Secondary | ICD-10-CM | POA: Diagnosis not present

## 2023-02-24 DIAGNOSIS — J441 Chronic obstructive pulmonary disease with (acute) exacerbation: Secondary | ICD-10-CM | POA: Diagnosis not present

## 2023-02-25 DIAGNOSIS — J439 Emphysema, unspecified: Secondary | ICD-10-CM | POA: Diagnosis not present

## 2023-02-25 DIAGNOSIS — I10 Essential (primary) hypertension: Secondary | ICD-10-CM | POA: Diagnosis not present

## 2023-02-25 DIAGNOSIS — J302 Other seasonal allergic rhinitis: Secondary | ICD-10-CM | POA: Diagnosis not present

## 2023-02-25 DIAGNOSIS — J9811 Atelectasis: Secondary | ICD-10-CM | POA: Diagnosis not present

## 2023-02-25 DIAGNOSIS — F410 Panic disorder [episodic paroxysmal anxiety] without agoraphobia: Secondary | ICD-10-CM | POA: Diagnosis not present

## 2023-02-25 DIAGNOSIS — I700 Atherosclerosis of aorta: Secondary | ICD-10-CM | POA: Diagnosis not present

## 2023-02-25 DIAGNOSIS — J441 Chronic obstructive pulmonary disease with (acute) exacerbation: Secondary | ICD-10-CM | POA: Diagnosis not present

## 2023-02-25 DIAGNOSIS — E785 Hyperlipidemia, unspecified: Secondary | ICD-10-CM | POA: Diagnosis not present

## 2023-02-25 DIAGNOSIS — F411 Generalized anxiety disorder: Secondary | ICD-10-CM | POA: Diagnosis not present

## 2023-02-26 DIAGNOSIS — J9811 Atelectasis: Secondary | ICD-10-CM | POA: Diagnosis not present

## 2023-02-26 DIAGNOSIS — J441 Chronic obstructive pulmonary disease with (acute) exacerbation: Secondary | ICD-10-CM | POA: Diagnosis not present

## 2023-02-26 DIAGNOSIS — I10 Essential (primary) hypertension: Secondary | ICD-10-CM | POA: Diagnosis not present

## 2023-02-26 DIAGNOSIS — I700 Atherosclerosis of aorta: Secondary | ICD-10-CM | POA: Diagnosis not present

## 2023-02-26 DIAGNOSIS — E785 Hyperlipidemia, unspecified: Secondary | ICD-10-CM | POA: Diagnosis not present

## 2023-02-26 DIAGNOSIS — J302 Other seasonal allergic rhinitis: Secondary | ICD-10-CM | POA: Diagnosis not present

## 2023-02-26 DIAGNOSIS — F411 Generalized anxiety disorder: Secondary | ICD-10-CM | POA: Diagnosis not present

## 2023-02-26 DIAGNOSIS — J439 Emphysema, unspecified: Secondary | ICD-10-CM | POA: Diagnosis not present

## 2023-02-26 DIAGNOSIS — F410 Panic disorder [episodic paroxysmal anxiety] without agoraphobia: Secondary | ICD-10-CM | POA: Diagnosis not present

## 2023-02-28 DIAGNOSIS — J9811 Atelectasis: Secondary | ICD-10-CM | POA: Diagnosis not present

## 2023-02-28 DIAGNOSIS — I700 Atherosclerosis of aorta: Secondary | ICD-10-CM | POA: Diagnosis not present

## 2023-02-28 DIAGNOSIS — J441 Chronic obstructive pulmonary disease with (acute) exacerbation: Secondary | ICD-10-CM | POA: Diagnosis not present

## 2023-02-28 DIAGNOSIS — J302 Other seasonal allergic rhinitis: Secondary | ICD-10-CM | POA: Diagnosis not present

## 2023-02-28 DIAGNOSIS — E785 Hyperlipidemia, unspecified: Secondary | ICD-10-CM | POA: Diagnosis not present

## 2023-02-28 DIAGNOSIS — I10 Essential (primary) hypertension: Secondary | ICD-10-CM | POA: Diagnosis not present

## 2023-02-28 DIAGNOSIS — J439 Emphysema, unspecified: Secondary | ICD-10-CM | POA: Diagnosis not present

## 2023-02-28 DIAGNOSIS — F411 Generalized anxiety disorder: Secondary | ICD-10-CM | POA: Diagnosis not present

## 2023-02-28 DIAGNOSIS — F410 Panic disorder [episodic paroxysmal anxiety] without agoraphobia: Secondary | ICD-10-CM | POA: Diagnosis not present

## 2023-03-03 DIAGNOSIS — J302 Other seasonal allergic rhinitis: Secondary | ICD-10-CM | POA: Diagnosis not present

## 2023-03-03 DIAGNOSIS — I700 Atherosclerosis of aorta: Secondary | ICD-10-CM | POA: Diagnosis not present

## 2023-03-03 DIAGNOSIS — F410 Panic disorder [episodic paroxysmal anxiety] without agoraphobia: Secondary | ICD-10-CM | POA: Diagnosis not present

## 2023-03-03 DIAGNOSIS — J9811 Atelectasis: Secondary | ICD-10-CM | POA: Diagnosis not present

## 2023-03-03 DIAGNOSIS — E785 Hyperlipidemia, unspecified: Secondary | ICD-10-CM | POA: Diagnosis not present

## 2023-03-03 DIAGNOSIS — J441 Chronic obstructive pulmonary disease with (acute) exacerbation: Secondary | ICD-10-CM | POA: Diagnosis not present

## 2023-03-03 DIAGNOSIS — F411 Generalized anxiety disorder: Secondary | ICD-10-CM | POA: Diagnosis not present

## 2023-03-03 DIAGNOSIS — J439 Emphysema, unspecified: Secondary | ICD-10-CM | POA: Diagnosis not present

## 2023-03-03 DIAGNOSIS — I10 Essential (primary) hypertension: Secondary | ICD-10-CM | POA: Diagnosis not present

## 2023-03-06 DIAGNOSIS — F410 Panic disorder [episodic paroxysmal anxiety] without agoraphobia: Secondary | ICD-10-CM | POA: Diagnosis not present

## 2023-03-06 DIAGNOSIS — E785 Hyperlipidemia, unspecified: Secondary | ICD-10-CM | POA: Diagnosis not present

## 2023-03-06 DIAGNOSIS — I10 Essential (primary) hypertension: Secondary | ICD-10-CM | POA: Diagnosis not present

## 2023-03-06 DIAGNOSIS — J9811 Atelectasis: Secondary | ICD-10-CM | POA: Diagnosis not present

## 2023-03-06 DIAGNOSIS — I700 Atherosclerosis of aorta: Secondary | ICD-10-CM | POA: Diagnosis not present

## 2023-03-06 DIAGNOSIS — J302 Other seasonal allergic rhinitis: Secondary | ICD-10-CM | POA: Diagnosis not present

## 2023-03-06 DIAGNOSIS — J441 Chronic obstructive pulmonary disease with (acute) exacerbation: Secondary | ICD-10-CM | POA: Diagnosis not present

## 2023-03-06 DIAGNOSIS — J439 Emphysema, unspecified: Secondary | ICD-10-CM | POA: Diagnosis not present

## 2023-03-06 DIAGNOSIS — F411 Generalized anxiety disorder: Secondary | ICD-10-CM | POA: Diagnosis not present

## 2023-03-07 DIAGNOSIS — F410 Panic disorder [episodic paroxysmal anxiety] without agoraphobia: Secondary | ICD-10-CM | POA: Diagnosis not present

## 2023-03-07 DIAGNOSIS — I700 Atherosclerosis of aorta: Secondary | ICD-10-CM | POA: Diagnosis not present

## 2023-03-07 DIAGNOSIS — F411 Generalized anxiety disorder: Secondary | ICD-10-CM | POA: Diagnosis not present

## 2023-03-07 DIAGNOSIS — E785 Hyperlipidemia, unspecified: Secondary | ICD-10-CM | POA: Diagnosis not present

## 2023-03-07 DIAGNOSIS — J302 Other seasonal allergic rhinitis: Secondary | ICD-10-CM | POA: Diagnosis not present

## 2023-03-07 DIAGNOSIS — J439 Emphysema, unspecified: Secondary | ICD-10-CM | POA: Diagnosis not present

## 2023-03-07 DIAGNOSIS — J441 Chronic obstructive pulmonary disease with (acute) exacerbation: Secondary | ICD-10-CM | POA: Diagnosis not present

## 2023-03-07 DIAGNOSIS — I10 Essential (primary) hypertension: Secondary | ICD-10-CM | POA: Diagnosis not present

## 2023-03-07 DIAGNOSIS — J9811 Atelectasis: Secondary | ICD-10-CM | POA: Diagnosis not present

## 2023-03-11 DIAGNOSIS — J9811 Atelectasis: Secondary | ICD-10-CM | POA: Diagnosis not present

## 2023-03-11 DIAGNOSIS — F411 Generalized anxiety disorder: Secondary | ICD-10-CM | POA: Diagnosis not present

## 2023-03-11 DIAGNOSIS — E785 Hyperlipidemia, unspecified: Secondary | ICD-10-CM | POA: Diagnosis not present

## 2023-03-11 DIAGNOSIS — J441 Chronic obstructive pulmonary disease with (acute) exacerbation: Secondary | ICD-10-CM | POA: Diagnosis not present

## 2023-03-11 DIAGNOSIS — I700 Atherosclerosis of aorta: Secondary | ICD-10-CM | POA: Diagnosis not present

## 2023-03-11 DIAGNOSIS — F410 Panic disorder [episodic paroxysmal anxiety] without agoraphobia: Secondary | ICD-10-CM | POA: Diagnosis not present

## 2023-03-11 DIAGNOSIS — J439 Emphysema, unspecified: Secondary | ICD-10-CM | POA: Diagnosis not present

## 2023-03-11 DIAGNOSIS — I10 Essential (primary) hypertension: Secondary | ICD-10-CM | POA: Diagnosis not present

## 2023-03-11 DIAGNOSIS — J302 Other seasonal allergic rhinitis: Secondary | ICD-10-CM | POA: Diagnosis not present

## 2023-03-13 DIAGNOSIS — J441 Chronic obstructive pulmonary disease with (acute) exacerbation: Secondary | ICD-10-CM | POA: Diagnosis not present

## 2023-03-13 DIAGNOSIS — J449 Chronic obstructive pulmonary disease, unspecified: Secondary | ICD-10-CM | POA: Diagnosis not present

## 2023-03-13 DIAGNOSIS — F410 Panic disorder [episodic paroxysmal anxiety] without agoraphobia: Secondary | ICD-10-CM | POA: Diagnosis not present

## 2023-03-13 DIAGNOSIS — R0602 Shortness of breath: Secondary | ICD-10-CM | POA: Diagnosis not present

## 2023-03-13 DIAGNOSIS — J439 Emphysema, unspecified: Secondary | ICD-10-CM | POA: Diagnosis not present

## 2023-03-13 DIAGNOSIS — Z87891 Personal history of nicotine dependence: Secondary | ICD-10-CM | POA: Diagnosis not present

## 2023-03-13 DIAGNOSIS — R0600 Dyspnea, unspecified: Secondary | ICD-10-CM | POA: Diagnosis not present

## 2023-03-13 DIAGNOSIS — F411 Generalized anxiety disorder: Secondary | ICD-10-CM | POA: Diagnosis not present

## 2023-03-13 DIAGNOSIS — J302 Other seasonal allergic rhinitis: Secondary | ICD-10-CM | POA: Diagnosis not present

## 2023-03-13 DIAGNOSIS — E785 Hyperlipidemia, unspecified: Secondary | ICD-10-CM | POA: Diagnosis not present

## 2023-03-13 DIAGNOSIS — I700 Atherosclerosis of aorta: Secondary | ICD-10-CM | POA: Diagnosis not present

## 2023-03-13 DIAGNOSIS — J9811 Atelectasis: Secondary | ICD-10-CM | POA: Diagnosis not present

## 2023-03-13 DIAGNOSIS — K441 Diaphragmatic hernia with gangrene: Secondary | ICD-10-CM | POA: Diagnosis not present

## 2023-03-13 DIAGNOSIS — I10 Essential (primary) hypertension: Secondary | ICD-10-CM | POA: Diagnosis not present

## 2023-03-13 DIAGNOSIS — R609 Edema, unspecified: Secondary | ICD-10-CM | POA: Diagnosis not present

## 2023-03-14 DIAGNOSIS — J302 Other seasonal allergic rhinitis: Secondary | ICD-10-CM | POA: Diagnosis not present

## 2023-03-14 DIAGNOSIS — F410 Panic disorder [episodic paroxysmal anxiety] without agoraphobia: Secondary | ICD-10-CM | POA: Diagnosis not present

## 2023-03-14 DIAGNOSIS — F411 Generalized anxiety disorder: Secondary | ICD-10-CM | POA: Diagnosis not present

## 2023-03-14 DIAGNOSIS — E785 Hyperlipidemia, unspecified: Secondary | ICD-10-CM | POA: Diagnosis not present

## 2023-03-14 DIAGNOSIS — J9811 Atelectasis: Secondary | ICD-10-CM | POA: Diagnosis not present

## 2023-03-14 DIAGNOSIS — I10 Essential (primary) hypertension: Secondary | ICD-10-CM | POA: Diagnosis not present

## 2023-03-14 DIAGNOSIS — J439 Emphysema, unspecified: Secondary | ICD-10-CM | POA: Diagnosis not present

## 2023-03-14 DIAGNOSIS — I700 Atherosclerosis of aorta: Secondary | ICD-10-CM | POA: Diagnosis not present

## 2023-03-14 DIAGNOSIS — J441 Chronic obstructive pulmonary disease with (acute) exacerbation: Secondary | ICD-10-CM | POA: Diagnosis not present

## 2023-03-18 DIAGNOSIS — J302 Other seasonal allergic rhinitis: Secondary | ICD-10-CM | POA: Diagnosis not present

## 2023-03-18 DIAGNOSIS — I700 Atherosclerosis of aorta: Secondary | ICD-10-CM | POA: Diagnosis not present

## 2023-03-18 DIAGNOSIS — J439 Emphysema, unspecified: Secondary | ICD-10-CM | POA: Diagnosis not present

## 2023-03-18 DIAGNOSIS — J9811 Atelectasis: Secondary | ICD-10-CM | POA: Diagnosis not present

## 2023-03-18 DIAGNOSIS — F411 Generalized anxiety disorder: Secondary | ICD-10-CM | POA: Diagnosis not present

## 2023-03-18 DIAGNOSIS — F410 Panic disorder [episodic paroxysmal anxiety] without agoraphobia: Secondary | ICD-10-CM | POA: Diagnosis not present

## 2023-03-18 DIAGNOSIS — J441 Chronic obstructive pulmonary disease with (acute) exacerbation: Secondary | ICD-10-CM | POA: Diagnosis not present

## 2023-03-18 DIAGNOSIS — E785 Hyperlipidemia, unspecified: Secondary | ICD-10-CM | POA: Diagnosis not present

## 2023-03-18 DIAGNOSIS — I10 Essential (primary) hypertension: Secondary | ICD-10-CM | POA: Diagnosis not present

## 2023-03-20 DIAGNOSIS — E785 Hyperlipidemia, unspecified: Secondary | ICD-10-CM | POA: Diagnosis not present

## 2023-03-20 DIAGNOSIS — J302 Other seasonal allergic rhinitis: Secondary | ICD-10-CM | POA: Diagnosis not present

## 2023-03-20 DIAGNOSIS — J9811 Atelectasis: Secondary | ICD-10-CM | POA: Diagnosis not present

## 2023-03-20 DIAGNOSIS — I10 Essential (primary) hypertension: Secondary | ICD-10-CM | POA: Diagnosis not present

## 2023-03-20 DIAGNOSIS — F410 Panic disorder [episodic paroxysmal anxiety] without agoraphobia: Secondary | ICD-10-CM | POA: Diagnosis not present

## 2023-03-20 DIAGNOSIS — J441 Chronic obstructive pulmonary disease with (acute) exacerbation: Secondary | ICD-10-CM | POA: Diagnosis not present

## 2023-03-20 DIAGNOSIS — I700 Atherosclerosis of aorta: Secondary | ICD-10-CM | POA: Diagnosis not present

## 2023-03-20 DIAGNOSIS — F411 Generalized anxiety disorder: Secondary | ICD-10-CM | POA: Diagnosis not present

## 2023-03-20 DIAGNOSIS — J439 Emphysema, unspecified: Secondary | ICD-10-CM | POA: Diagnosis not present

## 2023-04-10 ENCOUNTER — Other Ambulatory Visit: Payer: Self-pay | Admitting: Internal Medicine

## 2023-05-02 ENCOUNTER — Other Ambulatory Visit: Payer: Self-pay | Admitting: Internal Medicine

## 2023-05-05 NOTE — Telephone Encounter (Signed)
Pt needs an appt for further  refills 

## 2023-06-04 DIAGNOSIS — Z9981 Dependence on supplemental oxygen: Secondary | ICD-10-CM | POA: Diagnosis not present

## 2023-06-04 DIAGNOSIS — J441 Chronic obstructive pulmonary disease with (acute) exacerbation: Secondary | ICD-10-CM | POA: Diagnosis not present

## 2023-06-04 DIAGNOSIS — Z743 Need for continuous supervision: Secondary | ICD-10-CM | POA: Diagnosis not present

## 2023-06-04 DIAGNOSIS — I700 Atherosclerosis of aorta: Secondary | ICD-10-CM | POA: Diagnosis not present

## 2023-06-04 DIAGNOSIS — R0902 Hypoxemia: Secondary | ICD-10-CM | POA: Diagnosis not present

## 2023-06-04 DIAGNOSIS — J449 Chronic obstructive pulmonary disease, unspecified: Secondary | ICD-10-CM | POA: Diagnosis not present

## 2023-06-04 DIAGNOSIS — I10 Essential (primary) hypertension: Secondary | ICD-10-CM | POA: Diagnosis not present

## 2023-06-04 DIAGNOSIS — R0602 Shortness of breath: Secondary | ICD-10-CM | POA: Diagnosis not present

## 2023-06-04 DIAGNOSIS — J9600 Acute respiratory failure, unspecified whether with hypoxia or hypercapnia: Secondary | ICD-10-CM | POA: Diagnosis not present

## 2023-06-06 ENCOUNTER — Other Ambulatory Visit: Payer: Self-pay | Admitting: Internal Medicine

## 2023-06-06 ENCOUNTER — Other Ambulatory Visit: Payer: Self-pay

## 2023-06-06 DIAGNOSIS — R918 Other nonspecific abnormal finding of lung field: Secondary | ICD-10-CM | POA: Diagnosis not present

## 2023-06-06 DIAGNOSIS — R531 Weakness: Secondary | ICD-10-CM | POA: Diagnosis not present

## 2023-06-06 DIAGNOSIS — I10 Essential (primary) hypertension: Secondary | ICD-10-CM | POA: Diagnosis not present

## 2023-06-06 DIAGNOSIS — E876 Hypokalemia: Secondary | ICD-10-CM | POA: Diagnosis not present

## 2023-06-06 DIAGNOSIS — Z87891 Personal history of nicotine dependence: Secondary | ICD-10-CM | POA: Diagnosis not present

## 2023-06-06 DIAGNOSIS — R069 Unspecified abnormalities of breathing: Secondary | ICD-10-CM | POA: Diagnosis not present

## 2023-06-06 DIAGNOSIS — J440 Chronic obstructive pulmonary disease with acute lower respiratory infection: Secondary | ICD-10-CM | POA: Diagnosis not present

## 2023-06-06 DIAGNOSIS — Z743 Need for continuous supervision: Secondary | ICD-10-CM | POA: Diagnosis not present

## 2023-06-06 DIAGNOSIS — J441 Chronic obstructive pulmonary disease with (acute) exacerbation: Secondary | ICD-10-CM | POA: Diagnosis not present

## 2023-06-06 DIAGNOSIS — R062 Wheezing: Secondary | ICD-10-CM | POA: Diagnosis not present

## 2023-06-06 DIAGNOSIS — Z9981 Dependence on supplemental oxygen: Secondary | ICD-10-CM | POA: Diagnosis not present

## 2023-06-10 ENCOUNTER — Other Ambulatory Visit: Payer: Self-pay | Admitting: Internal Medicine

## 2023-06-10 DIAGNOSIS — R000 Tachycardia, unspecified: Secondary | ICD-10-CM | POA: Diagnosis not present

## 2023-06-10 DIAGNOSIS — I10 Essential (primary) hypertension: Secondary | ICD-10-CM | POA: Diagnosis not present

## 2023-06-10 DIAGNOSIS — I2510 Atherosclerotic heart disease of native coronary artery without angina pectoris: Secondary | ICD-10-CM | POA: Diagnosis not present

## 2023-06-10 DIAGNOSIS — E041 Nontoxic single thyroid nodule: Secondary | ICD-10-CM | POA: Diagnosis not present

## 2023-06-10 DIAGNOSIS — I700 Atherosclerosis of aorta: Secondary | ICD-10-CM | POA: Diagnosis not present

## 2023-06-10 DIAGNOSIS — J441 Chronic obstructive pulmonary disease with (acute) exacerbation: Secondary | ICD-10-CM | POA: Diagnosis not present

## 2023-06-10 DIAGNOSIS — J449 Chronic obstructive pulmonary disease, unspecified: Secondary | ICD-10-CM | POA: Diagnosis not present

## 2023-06-10 DIAGNOSIS — R0602 Shortness of breath: Secondary | ICD-10-CM | POA: Diagnosis not present

## 2023-06-10 DIAGNOSIS — J439 Emphysema, unspecified: Secondary | ICD-10-CM | POA: Diagnosis not present

## 2023-06-10 DIAGNOSIS — R457 State of emotional shock and stress, unspecified: Secondary | ICD-10-CM | POA: Diagnosis not present

## 2023-06-10 DIAGNOSIS — Z87891 Personal history of nicotine dependence: Secondary | ICD-10-CM | POA: Diagnosis not present

## 2023-06-12 ENCOUNTER — Telehealth: Payer: Self-pay

## 2023-06-12 NOTE — Telephone Encounter (Signed)
Transition Care Management Unsuccessful Follow-up Telephone Call  Date of discharge and from where:  06/06/2023 [facility]  Attempts:  1st Attempt  Reason for unsuccessful TCM follow-up call:  Left voice message  Evangela Heffler [facility]  [facility] Population Health Community Resource Care Guide   [email or web address]  ?? [phone]   Website: [email or web address]  [email or web address]

## 2023-06-12 NOTE — Telephone Encounter (Signed)
Transition Care Management Follow-up Telephone Call Date of discharge and from where: 06/07/2023 [facility] How have you been since you were released from the hospital? Patient stated she is feeling better, but SOB. Any questions or concerns? No  Items Reviewed: Did the pt receive and understand the discharge instructions provided? Yes  Medications obtained and verified? Yes  Other?  Verified patient's home address to mail [facility] transportation brochure. Any new allergies since your discharge? No  Dietary orders reviewed? Yes Do you have support at home? Yes   Follow up appointments reviewed:  PCP Hospital f/u appt confirmed? Yes  Scheduled to see Eliezer Champagne, MD on 06/13/2023 @ [facility]. Specialist Hospital f/u appt confirmed? No  Scheduled to see  on  @ . Are transportation arrangements needed?  Patient does not need transportation now but asked that [facility] transportation brochure be mailed to her home address. If their condition worsens, is the pt aware to call PCP or go to the Emergency Dept.? Yes Was the patient provided with contact information for the PCP's office or ED? Yes Was to pt encouraged to call back with questions or concerns? Yes  Britainy Kozub [facility]  [facility] Population Health Community Resource Care Guide   [email or web address]  ?? [phone]   Website: [email or web address]  [email or web address]

## 2023-06-13 ENCOUNTER — Telehealth: Payer: Medicare HMO | Admitting: Internal Medicine

## 2023-06-13 DIAGNOSIS — J441 Chronic obstructive pulmonary disease with (acute) exacerbation: Secondary | ICD-10-CM | POA: Diagnosis not present

## 2023-06-13 MED ORDER — IPRATROPIUM-ALBUTEROL 0.5-2.5 (3) MG/3ML IN SOLN: 3.0000 mL | RESPIRATORY_TRACT | 0 refills | Status: AC | PRN

## 2023-06-13 MED ORDER — TRELEGY ELLIPTA 100-62.5-25 MCG/ACT IN AEPB: 1.0000 | INHALATION_SPRAY | Freq: Every day | RESPIRATORY_TRACT | 5 refills | Status: AC

## 2023-06-13 NOTE — Progress Notes (Signed)
Office Visit  Subjective   Patient ID: Andrea Collins   DOB: 1951/06/14   Age: 72 y.o.   MRN: [id number]   Chief Complaint No chief complaint on file.    History of Present Illness Andrea Collins is a 3w yo female who calls in today for a telehealth visit for a recent ER visit.  She was seen for a COPD exacerbation at [facility] on 06/10/2023.  They did a CTA of her chest at that time which showed no pulmonary emboli or other acute chest finding.  She had emphysema and pulmonary scarring. Nodule that was question on the previous study in the left upper lobe looks like a linear scar today.  There was aortic atherosclerosis. Coronary artery calcification.  There was a 3.2 cm right thyroid nodule that has been previously evaluated.  This shows enlargement over time.  She was having problems with SOB, chest tightness and wheezing that occurred a week before that.  She was sent out with a prednisone taper.  Today, she states her breathing is doing well and denies any wheezing, fevers, chills, cough or other problems.  The patient has a histor of COPD.  She has a history of chronic respiratory failure where where she is currently on 4L of oxygen via [place].  She currently remains on albuterol HFA which she uses multiple times during the day.  She used to be on trelegy and she is no longer on daliresp.   She does not have SOB at rest but only has DOE where she used to be able to walk to her mailbox and back at home without getting SOB.  This was about 400 feet.  She may walk 100 feet before getting SOB.  She had COVID-19 in 09/2020 and has no long term sequalae.  She did not have any COPD exacerbations in the year of 2022 and I have not seen her since 11/2020 due to her not having any acute issues.  She underwent a CTA of the chest which showed no evidence of pulmonary embolism or pneumonia but she did have moderate to severe emphysema.  Her last COPD exacerbations where in 05/2020 and another in 09/2019.  She did see  pulmonary on 06/2020 and they felt she was stable and restarted her on Spiriva.  She used to be on Symbicort, Spiriva and Ventolin prn.  She does have duoneb machine but has not been using it.  She has been on chronic oxygen since 11/2016. They performed a CT scan of her chest with contrast which showed no evidence of PE but she has possible pneumonia. They found an incidental finding of a 1.9 cm lesion in the right thyroid. There was also a mild irregular mural thrombus along the proximal abdominal aorta without luminal narrowing. See PMH for summary of pulmonary history and lab reports for status of any previous PFTs: Allergies, Chronic Obstructive Pulmonary Disease, and Tobacco Abuse. Comorbid conditions : none. She has the following modifiable risk factors: sedentary lifestyle. Specifically denied complaints: increased wheezing, productive cough, change in color of sputum, pleuritic chest pain, purulent sputum, and hemoptysis. She quit smoking around 2016.   She is not having increased SOB or wheezing.          Past Medical History Past Medical History:  Diagnosis Date   COPD (chronic obstructive pulmonary disease) (HCC)    Essential hypertension    GAD (generalized anxiety disorder)    Seasonal allergies    Tobacco abuse  Allergies No Known Allergies   Medications  Current Outpatient Medications:    albuterol (VENTOLIN HFA) 108 (90 Base) MCG/ACT inhaler, INHALE 1 TO 2 PUFFS BY MOUTH EVERY 6 HOURS AS NEEDED FOR WHEEZING OR SHORTNESS OF BREATH, Disp: 8.5 g, Rfl: 0   ALPRAZolam (XANAX) 0.5 MG tablet, Take 1 tablet (0.5 mg total) by mouth daily as needed for anxiety., Disp: 30 tablet, Rfl: 0   benzonatate (TESSALON) 100 MG capsule, Take by mouth 3 (three) times daily as needed for cough., Disp: , Rfl:    bisoprolol (ZEBETA) 5 MG tablet, Take 5 mg by mouth daily., Disp: , Rfl:    chlorthalidone (HYGROTON) 25 MG tablet, Take 25 mg by mouth daily. Take 1/2 tab, Disp: , Rfl:     cholecalciferol (VITAMIN D3) 25 MCG (1000 UT) tablet, Take 1,000 Units by mouth daily., Disp: , Rfl:    fluticasone (FLONASE) 50 MCG/ACT nasal spray, Place 1 spray into both nostrils daily., Disp: , Rfl:    ipratropium-albuterol (DUONEB) 0.5-2.5 (3) MG/3ML SOLN, Take 3 mLs by nebulization every 4 (four) hours as needed., Disp: , Rfl:    tiotropium (SPIRIVA) 18 MCG inhalation capsule, Place 1 capsule (18 mcg total) into inhaler and inhale daily., Disp: 30 capsule, Rfl: 3   Review of Systems Review of Systems  Constitutional:  Negative for chills and fever.  Respiratory:  Negative for cough, hemoptysis, sputum production, shortness of breath and wheezing.   Cardiovascular:  Negative for chest pain and palpitations.  Skin:  Negative for itching and rash.  Neurological:  Negative for dizziness, weakness and headaches.       Objective:    Vitals There were no vitals taken for this visit.   Physical Examination Physical Exam Constitutional:      Appearance: Normal appearance.  Neurological:     Mental Status: She is alert.  Psychiatric:        Mood and Affect: Mood normal.        Behavior: Behavior normal.        Assessment & Plan:   COPD exacerbation (HCC) She has a steriod taper with prednisone that I asked her to finish.  She seems to be doing better today.  She is no longer on trelegy and we are going to restart her on this and refill her albuterol nebs.    No follow-ups on file.   Crist Fat, MD

## 2023-06-13 NOTE — Assessment & Plan Note (Signed)
She has a steriod taper with prednisone that I asked her to finish.  She seems to be doing better today.  She is no longer on trelegy and we are going to restart her on this and refill her albuterol nebs.

## 2023-06-16 DIAGNOSIS — R062 Wheezing: Secondary | ICD-10-CM | POA: Diagnosis not present

## 2023-06-16 DIAGNOSIS — R000 Tachycardia, unspecified: Secondary | ICD-10-CM | POA: Diagnosis not present

## 2023-06-16 DIAGNOSIS — G4489 Other headache syndrome: Secondary | ICD-10-CM | POA: Diagnosis not present

## 2023-06-16 DIAGNOSIS — K219 Gastro-esophageal reflux disease without esophagitis: Secondary | ICD-10-CM | POA: Diagnosis not present

## 2023-06-16 DIAGNOSIS — R0602 Shortness of breath: Secondary | ICD-10-CM | POA: Diagnosis not present

## 2023-06-16 DIAGNOSIS — Z7401 Bed confinement status: Secondary | ICD-10-CM | POA: Diagnosis not present

## 2023-06-16 DIAGNOSIS — I700 Atherosclerosis of aorta: Secondary | ICD-10-CM | POA: Diagnosis not present

## 2023-06-16 DIAGNOSIS — J439 Emphysema, unspecified: Secondary | ICD-10-CM | POA: Diagnosis not present

## 2023-06-16 DIAGNOSIS — R231 Pallor: Secondary | ICD-10-CM | POA: Diagnosis not present

## 2023-06-16 DIAGNOSIS — R531 Weakness: Secondary | ICD-10-CM | POA: Diagnosis not present

## 2023-06-16 DIAGNOSIS — Z743 Need for continuous supervision: Secondary | ICD-10-CM | POA: Diagnosis not present

## 2023-06-16 DIAGNOSIS — R069 Unspecified abnormalities of breathing: Secondary | ICD-10-CM | POA: Diagnosis not present

## 2023-06-16 DIAGNOSIS — I10 Essential (primary) hypertension: Secondary | ICD-10-CM | POA: Diagnosis not present

## 2023-06-16 DIAGNOSIS — R0689 Other abnormalities of breathing: Secondary | ICD-10-CM | POA: Diagnosis not present

## 2023-06-16 DIAGNOSIS — J441 Chronic obstructive pulmonary disease with (acute) exacerbation: Secondary | ICD-10-CM | POA: Diagnosis not present

## 2023-06-18 DIAGNOSIS — R062 Wheezing: Secondary | ICD-10-CM | POA: Diagnosis not present

## 2023-06-18 DIAGNOSIS — R0602 Shortness of breath: Secondary | ICD-10-CM | POA: Diagnosis not present

## 2023-06-18 DIAGNOSIS — R000 Tachycardia, unspecified: Secondary | ICD-10-CM | POA: Diagnosis not present

## 2023-06-18 DIAGNOSIS — Z20822 Contact with and (suspected) exposure to covid-19: Secondary | ICD-10-CM | POA: Diagnosis not present

## 2023-06-18 DIAGNOSIS — I771 Stricture of artery: Secondary | ICD-10-CM | POA: Diagnosis not present

## 2023-06-18 DIAGNOSIS — I10 Essential (primary) hypertension: Secondary | ICD-10-CM | POA: Diagnosis not present

## 2023-06-18 DIAGNOSIS — G4489 Other headache syndrome: Secondary | ICD-10-CM | POA: Diagnosis not present

## 2023-06-18 DIAGNOSIS — J441 Chronic obstructive pulmonary disease with (acute) exacerbation: Secondary | ICD-10-CM | POA: Diagnosis not present

## 2023-06-18 DIAGNOSIS — R069 Unspecified abnormalities of breathing: Secondary | ICD-10-CM | POA: Diagnosis not present

## 2023-06-18 DIAGNOSIS — I700 Atherosclerosis of aorta: Secondary | ICD-10-CM | POA: Diagnosis not present

## 2023-06-19 DIAGNOSIS — J441 Chronic obstructive pulmonary disease with (acute) exacerbation: Secondary | ICD-10-CM | POA: Diagnosis not present

## 2023-06-19 DIAGNOSIS — R0602 Shortness of breath: Secondary | ICD-10-CM | POA: Diagnosis not present

## 2023-06-19 DIAGNOSIS — I700 Atherosclerosis of aorta: Secondary | ICD-10-CM | POA: Diagnosis not present

## 2023-06-19 DIAGNOSIS — R531 Weakness: Secondary | ICD-10-CM | POA: Diagnosis not present

## 2023-06-19 DIAGNOSIS — Z9981 Dependence on supplemental oxygen: Secondary | ICD-10-CM | POA: Diagnosis not present

## 2023-06-19 DIAGNOSIS — Z743 Need for continuous supervision: Secondary | ICD-10-CM | POA: Diagnosis not present

## 2023-06-19 DIAGNOSIS — I771 Stricture of artery: Secondary | ICD-10-CM | POA: Diagnosis not present

## 2023-06-19 DIAGNOSIS — I10 Essential (primary) hypertension: Secondary | ICD-10-CM | POA: Diagnosis not present

## 2023-06-19 DIAGNOSIS — R000 Tachycardia, unspecified: Secondary | ICD-10-CM | POA: Diagnosis not present

## 2023-06-21 ENCOUNTER — Other Ambulatory Visit: Payer: Self-pay | Admitting: Internal Medicine

## 2023-06-26 DIAGNOSIS — R000 Tachycardia, unspecified: Secondary | ICD-10-CM | POA: Diagnosis not present

## 2023-06-26 DIAGNOSIS — R0602 Shortness of breath: Secondary | ICD-10-CM | POA: Diagnosis not present

## 2023-06-26 DIAGNOSIS — R0789 Other chest pain: Secondary | ICD-10-CM | POA: Diagnosis not present

## 2023-06-26 DIAGNOSIS — R519 Headache, unspecified: Secondary | ICD-10-CM | POA: Diagnosis not present

## 2023-06-26 DIAGNOSIS — R079 Chest pain, unspecified: Secondary | ICD-10-CM | POA: Diagnosis not present

## 2023-06-26 DIAGNOSIS — R062 Wheezing: Secondary | ICD-10-CM | POA: Diagnosis not present

## 2023-06-26 DIAGNOSIS — J449 Chronic obstructive pulmonary disease, unspecified: Secondary | ICD-10-CM | POA: Diagnosis not present

## 2023-06-26 DIAGNOSIS — J209 Acute bronchitis, unspecified: Secondary | ICD-10-CM | POA: Diagnosis not present

## 2023-06-26 DIAGNOSIS — G4489 Other headache syndrome: Secondary | ICD-10-CM | POA: Diagnosis not present

## 2023-06-26 DIAGNOSIS — J40 Bronchitis, not specified as acute or chronic: Secondary | ICD-10-CM | POA: Diagnosis not present

## 2023-06-26 DIAGNOSIS — I10 Essential (primary) hypertension: Secondary | ICD-10-CM | POA: Diagnosis not present

## 2023-06-27 DIAGNOSIS — R531 Weakness: Secondary | ICD-10-CM | POA: Diagnosis not present

## 2023-06-27 DIAGNOSIS — R079 Chest pain, unspecified: Secondary | ICD-10-CM | POA: Diagnosis not present

## 2023-06-27 DIAGNOSIS — J9611 Chronic respiratory failure with hypoxia: Secondary | ICD-10-CM | POA: Diagnosis not present

## 2023-06-27 DIAGNOSIS — J449 Chronic obstructive pulmonary disease, unspecified: Secondary | ICD-10-CM | POA: Diagnosis not present

## 2023-06-27 DIAGNOSIS — Z7952 Long term (current) use of systemic steroids: Secondary | ICD-10-CM | POA: Diagnosis not present

## 2023-06-27 DIAGNOSIS — J209 Acute bronchitis, unspecified: Secondary | ICD-10-CM | POA: Diagnosis not present

## 2023-06-27 DIAGNOSIS — I10 Essential (primary) hypertension: Secondary | ICD-10-CM | POA: Diagnosis not present

## 2023-06-27 DIAGNOSIS — Z7401 Bed confinement status: Secondary | ICD-10-CM | POA: Diagnosis not present

## 2023-06-27 DIAGNOSIS — R0602 Shortness of breath: Secondary | ICD-10-CM | POA: Diagnosis not present

## 2023-06-27 DIAGNOSIS — M1990 Unspecified osteoarthritis, unspecified site: Secondary | ICD-10-CM | POA: Diagnosis not present

## 2023-06-27 DIAGNOSIS — J440 Chronic obstructive pulmonary disease with acute lower respiratory infection: Secondary | ICD-10-CM | POA: Diagnosis not present

## 2023-06-27 DIAGNOSIS — F419 Anxiety disorder, unspecified: Secondary | ICD-10-CM | POA: Diagnosis not present

## 2023-06-27 DIAGNOSIS — R0789 Other chest pain: Secondary | ICD-10-CM | POA: Diagnosis not present

## 2023-06-27 DIAGNOSIS — J441 Chronic obstructive pulmonary disease with (acute) exacerbation: Secondary | ICD-10-CM | POA: Diagnosis not present

## 2023-06-27 DIAGNOSIS — J40 Bronchitis, not specified as acute or chronic: Secondary | ICD-10-CM | POA: Diagnosis not present

## 2023-06-27 DIAGNOSIS — R519 Headache, unspecified: Secondary | ICD-10-CM | POA: Diagnosis not present

## 2023-06-27 DIAGNOSIS — J439 Emphysema, unspecified: Secondary | ICD-10-CM | POA: Diagnosis not present

## 2023-07-03 DIAGNOSIS — G4489 Other headache syndrome: Secondary | ICD-10-CM | POA: Diagnosis not present

## 2023-07-03 DIAGNOSIS — I700 Atherosclerosis of aorta: Secondary | ICD-10-CM | POA: Diagnosis not present

## 2023-07-03 DIAGNOSIS — I10 Essential (primary) hypertension: Secondary | ICD-10-CM | POA: Diagnosis not present

## 2023-07-03 DIAGNOSIS — R519 Headache, unspecified: Secondary | ICD-10-CM | POA: Diagnosis not present

## 2023-07-03 DIAGNOSIS — R0689 Other abnormalities of breathing: Secondary | ICD-10-CM | POA: Diagnosis not present

## 2023-07-03 DIAGNOSIS — J441 Chronic obstructive pulmonary disease with (acute) exacerbation: Secondary | ICD-10-CM | POA: Diagnosis not present

## 2023-07-04 ENCOUNTER — Other Ambulatory Visit: Payer: Self-pay | Admitting: Internal Medicine

## 2023-07-04 DIAGNOSIS — R279 Unspecified lack of coordination: Secondary | ICD-10-CM | POA: Diagnosis not present

## 2023-07-04 DIAGNOSIS — Z743 Need for continuous supervision: Secondary | ICD-10-CM | POA: Diagnosis not present

## 2023-07-07 DIAGNOSIS — Z79899 Other long term (current) drug therapy: Secondary | ICD-10-CM | POA: Diagnosis not present

## 2023-07-07 DIAGNOSIS — G4489 Other headache syndrome: Secondary | ICD-10-CM | POA: Diagnosis not present

## 2023-07-07 DIAGNOSIS — J449 Chronic obstructive pulmonary disease, unspecified: Secondary | ICD-10-CM | POA: Diagnosis not present

## 2023-07-07 DIAGNOSIS — Z7952 Long term (current) use of systemic steroids: Secondary | ICD-10-CM | POA: Diagnosis not present

## 2023-07-07 DIAGNOSIS — J441 Chronic obstructive pulmonary disease with (acute) exacerbation: Secondary | ICD-10-CM | POA: Diagnosis not present

## 2023-07-07 DIAGNOSIS — Z87891 Personal history of nicotine dependence: Secondary | ICD-10-CM | POA: Diagnosis not present

## 2023-07-07 DIAGNOSIS — I10 Essential (primary) hypertension: Secondary | ICD-10-CM | POA: Diagnosis not present

## 2023-07-10 DIAGNOSIS — E041 Nontoxic single thyroid nodule: Secondary | ICD-10-CM | POA: Diagnosis not present

## 2023-07-10 DIAGNOSIS — R6883 Chills (without fever): Secondary | ICD-10-CM | POA: Diagnosis not present

## 2023-07-10 DIAGNOSIS — R457 State of emotional shock and stress, unspecified: Secondary | ICD-10-CM | POA: Diagnosis not present

## 2023-07-10 DIAGNOSIS — R059 Cough, unspecified: Secondary | ICD-10-CM | POA: Diagnosis not present

## 2023-07-10 DIAGNOSIS — J441 Chronic obstructive pulmonary disease with (acute) exacerbation: Secondary | ICD-10-CM | POA: Diagnosis not present

## 2023-07-10 DIAGNOSIS — Z9981 Dependence on supplemental oxygen: Secondary | ICD-10-CM | POA: Diagnosis not present

## 2023-07-10 DIAGNOSIS — F0390 Unspecified dementia without behavioral disturbance: Secondary | ICD-10-CM | POA: Diagnosis not present

## 2023-07-10 DIAGNOSIS — E509 Vitamin A deficiency, unspecified: Secondary | ICD-10-CM | POA: Diagnosis not present

## 2023-07-10 DIAGNOSIS — I700 Atherosclerosis of aorta: Secondary | ICD-10-CM | POA: Diagnosis not present

## 2023-07-10 DIAGNOSIS — J439 Emphysema, unspecified: Secondary | ICD-10-CM | POA: Diagnosis not present

## 2023-07-10 DIAGNOSIS — Z79899 Other long term (current) drug therapy: Secondary | ICD-10-CM | POA: Diagnosis not present

## 2023-07-10 DIAGNOSIS — G4489 Other headache syndrome: Secondary | ICD-10-CM | POA: Diagnosis not present

## 2023-07-10 DIAGNOSIS — R0602 Shortness of breath: Secondary | ICD-10-CM | POA: Diagnosis not present

## 2023-07-10 DIAGNOSIS — J449 Chronic obstructive pulmonary disease, unspecified: Secondary | ICD-10-CM | POA: Diagnosis not present

## 2023-07-10 DIAGNOSIS — E559 Vitamin D deficiency, unspecified: Secondary | ICD-10-CM | POA: Diagnosis not present

## 2023-07-10 DIAGNOSIS — I10 Essential (primary) hypertension: Secondary | ICD-10-CM | POA: Diagnosis not present

## 2023-07-10 DIAGNOSIS — R002 Palpitations: Secondary | ICD-10-CM | POA: Diagnosis not present

## 2023-07-11 DIAGNOSIS — R002 Palpitations: Secondary | ICD-10-CM | POA: Diagnosis not present

## 2023-07-11 DIAGNOSIS — I10 Essential (primary) hypertension: Secondary | ICD-10-CM | POA: Diagnosis not present

## 2023-07-11 DIAGNOSIS — J439 Emphysema, unspecified: Secondary | ICD-10-CM | POA: Diagnosis not present

## 2023-07-13 DIAGNOSIS — R002 Palpitations: Secondary | ICD-10-CM | POA: Diagnosis not present

## 2023-07-13 DIAGNOSIS — I10 Essential (primary) hypertension: Secondary | ICD-10-CM | POA: Diagnosis not present

## 2023-07-13 DIAGNOSIS — J439 Emphysema, unspecified: Secondary | ICD-10-CM | POA: Diagnosis not present

## 2023-07-14 DIAGNOSIS — I10 Essential (primary) hypertension: Secondary | ICD-10-CM | POA: Diagnosis not present

## 2023-07-14 DIAGNOSIS — J439 Emphysema, unspecified: Secondary | ICD-10-CM | POA: Diagnosis not present

## 2023-07-14 DIAGNOSIS — R002 Palpitations: Secondary | ICD-10-CM | POA: Diagnosis not present

## 2023-07-15 DIAGNOSIS — F0390 Unspecified dementia without behavioral disturbance: Secondary | ICD-10-CM | POA: Diagnosis not present

## 2023-07-15 DIAGNOSIS — J441 Chronic obstructive pulmonary disease with (acute) exacerbation: Secondary | ICD-10-CM | POA: Diagnosis not present

## 2023-07-15 DIAGNOSIS — R488 Other symbolic dysfunctions: Secondary | ICD-10-CM | POA: Diagnosis not present

## 2023-07-15 DIAGNOSIS — J449 Chronic obstructive pulmonary disease, unspecified: Secondary | ICD-10-CM | POA: Diagnosis not present

## 2023-07-15 DIAGNOSIS — R0602 Shortness of breath: Secondary | ICD-10-CM | POA: Diagnosis not present

## 2023-07-15 DIAGNOSIS — J9611 Chronic respiratory failure with hypoxia: Secondary | ICD-10-CM | POA: Diagnosis not present

## 2023-07-15 DIAGNOSIS — R413 Other amnesia: Secondary | ICD-10-CM | POA: Diagnosis not present

## 2023-07-15 DIAGNOSIS — B353 Tinea pedis: Secondary | ICD-10-CM | POA: Diagnosis not present

## 2023-07-15 DIAGNOSIS — E509 Vitamin A deficiency, unspecified: Secondary | ICD-10-CM | POA: Diagnosis not present

## 2023-07-15 DIAGNOSIS — Z7401 Bed confinement status: Secondary | ICD-10-CM | POA: Diagnosis not present

## 2023-07-15 DIAGNOSIS — R002 Palpitations: Secondary | ICD-10-CM | POA: Diagnosis not present

## 2023-07-15 DIAGNOSIS — D509 Iron deficiency anemia, unspecified: Secondary | ICD-10-CM | POA: Diagnosis not present

## 2023-07-15 DIAGNOSIS — R519 Headache, unspecified: Secondary | ICD-10-CM | POA: Diagnosis not present

## 2023-07-15 DIAGNOSIS — R2681 Unsteadiness on feet: Secondary | ICD-10-CM | POA: Diagnosis not present

## 2023-07-15 DIAGNOSIS — Z9981 Dependence on supplemental oxygen: Secondary | ICD-10-CM | POA: Diagnosis not present

## 2023-07-15 DIAGNOSIS — R457 State of emotional shock and stress, unspecified: Secondary | ICD-10-CM | POA: Diagnosis not present

## 2023-07-15 DIAGNOSIS — I5022 Chronic systolic (congestive) heart failure: Secondary | ICD-10-CM | POA: Diagnosis not present

## 2023-07-15 DIAGNOSIS — R279 Unspecified lack of coordination: Secondary | ICD-10-CM | POA: Diagnosis not present

## 2023-07-15 DIAGNOSIS — R000 Tachycardia, unspecified: Secondary | ICD-10-CM | POA: Diagnosis not present

## 2023-07-15 DIAGNOSIS — I959 Hypotension, unspecified: Secondary | ICD-10-CM | POA: Diagnosis not present

## 2023-07-15 DIAGNOSIS — Z7409 Other reduced mobility: Secondary | ICD-10-CM | POA: Diagnosis not present

## 2023-07-15 DIAGNOSIS — Z79899 Other long term (current) drug therapy: Secondary | ICD-10-CM | POA: Diagnosis not present

## 2023-07-15 DIAGNOSIS — Z743 Need for continuous supervision: Secondary | ICD-10-CM | POA: Diagnosis not present

## 2023-07-15 DIAGNOSIS — E559 Vitamin D deficiency, unspecified: Secondary | ICD-10-CM | POA: Diagnosis not present

## 2023-07-15 DIAGNOSIS — M6281 Muscle weakness (generalized): Secondary | ICD-10-CM | POA: Diagnosis not present

## 2023-07-15 DIAGNOSIS — R064 Hyperventilation: Secondary | ICD-10-CM | POA: Diagnosis not present

## 2023-07-15 DIAGNOSIS — I10 Essential (primary) hypertension: Secondary | ICD-10-CM | POA: Diagnosis not present

## 2023-07-15 DIAGNOSIS — E611 Iron deficiency: Secondary | ICD-10-CM | POA: Diagnosis not present

## 2023-07-15 DIAGNOSIS — R609 Edema, unspecified: Secondary | ICD-10-CM | POA: Diagnosis not present

## 2023-07-15 DIAGNOSIS — J439 Emphysema, unspecified: Secondary | ICD-10-CM | POA: Diagnosis not present

## 2023-07-15 DIAGNOSIS — R069 Unspecified abnormalities of breathing: Secondary | ICD-10-CM | POA: Diagnosis not present

## 2023-07-16 DIAGNOSIS — J449 Chronic obstructive pulmonary disease, unspecified: Secondary | ICD-10-CM | POA: Diagnosis not present

## 2023-07-16 DIAGNOSIS — I10 Essential (primary) hypertension: Secondary | ICD-10-CM | POA: Diagnosis not present

## 2023-07-16 DIAGNOSIS — E611 Iron deficiency: Secondary | ICD-10-CM | POA: Diagnosis not present

## 2023-07-16 DIAGNOSIS — I5022 Chronic systolic (congestive) heart failure: Secondary | ICD-10-CM | POA: Diagnosis not present

## 2023-07-21 DIAGNOSIS — Z9981 Dependence on supplemental oxygen: Secondary | ICD-10-CM | POA: Diagnosis not present

## 2023-07-21 DIAGNOSIS — R413 Other amnesia: Secondary | ICD-10-CM | POA: Diagnosis not present

## 2023-07-21 DIAGNOSIS — J449 Chronic obstructive pulmonary disease, unspecified: Secondary | ICD-10-CM | POA: Diagnosis not present

## 2023-07-21 DIAGNOSIS — I10 Essential (primary) hypertension: Secondary | ICD-10-CM | POA: Diagnosis not present

## 2023-07-22 DIAGNOSIS — R519 Headache, unspecified: Secondary | ICD-10-CM | POA: Diagnosis not present

## 2023-07-22 DIAGNOSIS — R0602 Shortness of breath: Secondary | ICD-10-CM | POA: Diagnosis not present

## 2023-07-22 DIAGNOSIS — J441 Chronic obstructive pulmonary disease with (acute) exacerbation: Secondary | ICD-10-CM | POA: Diagnosis not present

## 2023-07-23 DIAGNOSIS — R000 Tachycardia, unspecified: Secondary | ICD-10-CM | POA: Diagnosis not present

## 2023-07-23 DIAGNOSIS — I10 Essential (primary) hypertension: Secondary | ICD-10-CM | POA: Diagnosis not present

## 2023-07-24 DIAGNOSIS — J449 Chronic obstructive pulmonary disease, unspecified: Secondary | ICD-10-CM | POA: Diagnosis not present

## 2023-07-24 DIAGNOSIS — E611 Iron deficiency: Secondary | ICD-10-CM | POA: Diagnosis not present

## 2023-07-24 DIAGNOSIS — I10 Essential (primary) hypertension: Secondary | ICD-10-CM | POA: Diagnosis not present

## 2023-07-24 DIAGNOSIS — J9611 Chronic respiratory failure with hypoxia: Secondary | ICD-10-CM | POA: Diagnosis not present

## 2023-07-28 DIAGNOSIS — Z87891 Personal history of nicotine dependence: Secondary | ICD-10-CM | POA: Diagnosis not present

## 2023-07-28 DIAGNOSIS — J90 Pleural effusion, not elsewhere classified: Secondary | ICD-10-CM | POA: Diagnosis not present

## 2023-07-28 DIAGNOSIS — R000 Tachycardia, unspecified: Secondary | ICD-10-CM | POA: Diagnosis not present

## 2023-07-28 DIAGNOSIS — I10 Essential (primary) hypertension: Secondary | ICD-10-CM | POA: Diagnosis not present

## 2023-07-28 DIAGNOSIS — J441 Chronic obstructive pulmonary disease with (acute) exacerbation: Secondary | ICD-10-CM | POA: Diagnosis not present

## 2023-07-28 DIAGNOSIS — R0602 Shortness of breath: Secondary | ICD-10-CM | POA: Diagnosis not present

## 2023-07-28 DIAGNOSIS — R918 Other nonspecific abnormal finding of lung field: Secondary | ICD-10-CM | POA: Diagnosis not present

## 2023-07-28 DIAGNOSIS — J449 Chronic obstructive pulmonary disease, unspecified: Secondary | ICD-10-CM | POA: Diagnosis not present

## 2023-07-31 DIAGNOSIS — J441 Chronic obstructive pulmonary disease with (acute) exacerbation: Secondary | ICD-10-CM | POA: Diagnosis not present

## 2023-07-31 DIAGNOSIS — F411 Generalized anxiety disorder: Secondary | ICD-10-CM | POA: Diagnosis not present

## 2023-07-31 DIAGNOSIS — J9610 Chronic respiratory failure, unspecified whether with hypoxia or hypercapnia: Secondary | ICD-10-CM | POA: Diagnosis not present

## 2023-07-31 DIAGNOSIS — J439 Emphysema, unspecified: Secondary | ICD-10-CM | POA: Diagnosis not present

## 2023-07-31 DIAGNOSIS — I10 Essential (primary) hypertension: Secondary | ICD-10-CM | POA: Diagnosis not present

## 2023-07-31 DIAGNOSIS — J309 Allergic rhinitis, unspecified: Secondary | ICD-10-CM | POA: Diagnosis not present

## 2023-07-31 DIAGNOSIS — D509 Iron deficiency anemia, unspecified: Secondary | ICD-10-CM | POA: Diagnosis not present

## 2023-07-31 DIAGNOSIS — E041 Nontoxic single thyroid nodule: Secondary | ICD-10-CM | POA: Diagnosis not present

## 2023-07-31 DIAGNOSIS — E538 Deficiency of other specified B group vitamins: Secondary | ICD-10-CM | POA: Diagnosis not present

## 2023-08-05 DIAGNOSIS — J309 Allergic rhinitis, unspecified: Secondary | ICD-10-CM | POA: Diagnosis not present

## 2023-08-05 DIAGNOSIS — F411 Generalized anxiety disorder: Secondary | ICD-10-CM | POA: Diagnosis not present

## 2023-08-05 DIAGNOSIS — E538 Deficiency of other specified B group vitamins: Secondary | ICD-10-CM | POA: Diagnosis not present

## 2023-08-05 DIAGNOSIS — J9610 Chronic respiratory failure, unspecified whether with hypoxia or hypercapnia: Secondary | ICD-10-CM | POA: Diagnosis not present

## 2023-08-05 DIAGNOSIS — J441 Chronic obstructive pulmonary disease with (acute) exacerbation: Secondary | ICD-10-CM | POA: Diagnosis not present

## 2023-08-05 DIAGNOSIS — D509 Iron deficiency anemia, unspecified: Secondary | ICD-10-CM | POA: Diagnosis not present

## 2023-08-05 DIAGNOSIS — E041 Nontoxic single thyroid nodule: Secondary | ICD-10-CM | POA: Diagnosis not present

## 2023-08-05 DIAGNOSIS — I10 Essential (primary) hypertension: Secondary | ICD-10-CM | POA: Diagnosis not present

## 2023-08-05 DIAGNOSIS — J439 Emphysema, unspecified: Secondary | ICD-10-CM | POA: Diagnosis not present

## 2023-08-07 DIAGNOSIS — E538 Deficiency of other specified B group vitamins: Secondary | ICD-10-CM | POA: Diagnosis not present

## 2023-08-07 DIAGNOSIS — J439 Emphysema, unspecified: Secondary | ICD-10-CM | POA: Diagnosis not present

## 2023-08-07 DIAGNOSIS — J441 Chronic obstructive pulmonary disease with (acute) exacerbation: Secondary | ICD-10-CM | POA: Diagnosis not present

## 2023-08-07 DIAGNOSIS — J309 Allergic rhinitis, unspecified: Secondary | ICD-10-CM | POA: Diagnosis not present

## 2023-08-07 DIAGNOSIS — D509 Iron deficiency anemia, unspecified: Secondary | ICD-10-CM | POA: Diagnosis not present

## 2023-08-07 DIAGNOSIS — J9610 Chronic respiratory failure, unspecified whether with hypoxia or hypercapnia: Secondary | ICD-10-CM | POA: Diagnosis not present

## 2023-08-07 DIAGNOSIS — I10 Essential (primary) hypertension: Secondary | ICD-10-CM | POA: Diagnosis not present

## 2023-08-07 DIAGNOSIS — E041 Nontoxic single thyroid nodule: Secondary | ICD-10-CM | POA: Diagnosis not present

## 2023-08-07 DIAGNOSIS — F411 Generalized anxiety disorder: Secondary | ICD-10-CM | POA: Diagnosis not present

## 2023-08-12 DIAGNOSIS — I10 Essential (primary) hypertension: Secondary | ICD-10-CM | POA: Diagnosis not present

## 2023-08-12 DIAGNOSIS — R0602 Shortness of breath: Secondary | ICD-10-CM | POA: Diagnosis not present

## 2023-08-12 DIAGNOSIS — J441 Chronic obstructive pulmonary disease with (acute) exacerbation: Secondary | ICD-10-CM | POA: Diagnosis not present

## 2023-08-12 DIAGNOSIS — I700 Atherosclerosis of aorta: Secondary | ICD-10-CM | POA: Diagnosis not present

## 2023-08-12 DIAGNOSIS — R000 Tachycardia, unspecified: Secondary | ICD-10-CM | POA: Diagnosis not present

## 2023-08-13 DIAGNOSIS — R0602 Shortness of breath: Secondary | ICD-10-CM | POA: Diagnosis not present

## 2023-08-13 DIAGNOSIS — I700 Atherosclerosis of aorta: Secondary | ICD-10-CM | POA: Diagnosis not present

## 2023-08-13 DIAGNOSIS — J441 Chronic obstructive pulmonary disease with (acute) exacerbation: Secondary | ICD-10-CM | POA: Diagnosis not present

## 2023-08-14 DIAGNOSIS — J439 Emphysema, unspecified: Secondary | ICD-10-CM | POA: Diagnosis not present

## 2023-08-14 DIAGNOSIS — I700 Atherosclerosis of aorta: Secondary | ICD-10-CM | POA: Diagnosis not present

## 2023-08-14 DIAGNOSIS — Z743 Need for continuous supervision: Secondary | ICD-10-CM | POA: Diagnosis not present

## 2023-08-14 DIAGNOSIS — J90 Pleural effusion, not elsewhere classified: Secondary | ICD-10-CM | POA: Diagnosis not present

## 2023-08-14 DIAGNOSIS — I10 Essential (primary) hypertension: Secondary | ICD-10-CM | POA: Diagnosis not present

## 2023-08-14 DIAGNOSIS — I6782 Cerebral ischemia: Secondary | ICD-10-CM | POA: Diagnosis not present

## 2023-08-14 DIAGNOSIS — J441 Chronic obstructive pulmonary disease with (acute) exacerbation: Secondary | ICD-10-CM | POA: Diagnosis not present

## 2023-08-14 DIAGNOSIS — G4489 Other headache syndrome: Secondary | ICD-10-CM | POA: Diagnosis not present

## 2023-08-14 DIAGNOSIS — R519 Headache, unspecified: Secondary | ICD-10-CM | POA: Diagnosis not present

## 2023-08-14 DIAGNOSIS — Z79899 Other long term (current) drug therapy: Secondary | ICD-10-CM | POA: Diagnosis not present

## 2023-08-15 ENCOUNTER — Other Ambulatory Visit: Payer: Self-pay

## 2023-08-15 ENCOUNTER — Other Ambulatory Visit: Payer: Self-pay | Admitting: Internal Medicine

## 2023-08-15 DIAGNOSIS — J309 Allergic rhinitis, unspecified: Secondary | ICD-10-CM | POA: Diagnosis not present

## 2023-08-15 DIAGNOSIS — J9610 Chronic respiratory failure, unspecified whether with hypoxia or hypercapnia: Secondary | ICD-10-CM | POA: Diagnosis not present

## 2023-08-15 DIAGNOSIS — E041 Nontoxic single thyroid nodule: Secondary | ICD-10-CM | POA: Diagnosis not present

## 2023-08-15 DIAGNOSIS — J441 Chronic obstructive pulmonary disease with (acute) exacerbation: Secondary | ICD-10-CM | POA: Diagnosis not present

## 2023-08-15 DIAGNOSIS — J439 Emphysema, unspecified: Secondary | ICD-10-CM | POA: Diagnosis not present

## 2023-08-15 DIAGNOSIS — I10 Essential (primary) hypertension: Secondary | ICD-10-CM | POA: Diagnosis not present

## 2023-08-15 DIAGNOSIS — E538 Deficiency of other specified B group vitamins: Secondary | ICD-10-CM | POA: Diagnosis not present

## 2023-08-15 DIAGNOSIS — D509 Iron deficiency anemia, unspecified: Secondary | ICD-10-CM | POA: Diagnosis not present

## 2023-08-15 DIAGNOSIS — F411 Generalized anxiety disorder: Secondary | ICD-10-CM | POA: Diagnosis not present

## 2023-08-15 MED ORDER — ALBUTEROL SULFATE HFA 108 (90 BASE) MCG/ACT IN AERS: 2.0000 | INHALATION_SPRAY | RESPIRATORY_TRACT | 0 refills | Status: DC | PRN

## 2023-08-15 NOTE — Progress Notes (Signed)
Rx Refill

## 2023-08-16 DIAGNOSIS — E041 Nontoxic single thyroid nodule: Secondary | ICD-10-CM | POA: Diagnosis not present

## 2023-08-16 DIAGNOSIS — J309 Allergic rhinitis, unspecified: Secondary | ICD-10-CM | POA: Diagnosis not present

## 2023-08-16 DIAGNOSIS — E538 Deficiency of other specified B group vitamins: Secondary | ICD-10-CM | POA: Diagnosis not present

## 2023-08-16 DIAGNOSIS — F411 Generalized anxiety disorder: Secondary | ICD-10-CM | POA: Diagnosis not present

## 2023-08-16 DIAGNOSIS — J441 Chronic obstructive pulmonary disease with (acute) exacerbation: Secondary | ICD-10-CM | POA: Diagnosis not present

## 2023-08-16 DIAGNOSIS — J9610 Chronic respiratory failure, unspecified whether with hypoxia or hypercapnia: Secondary | ICD-10-CM | POA: Diagnosis not present

## 2023-08-16 DIAGNOSIS — J439 Emphysema, unspecified: Secondary | ICD-10-CM | POA: Diagnosis not present

## 2023-08-16 DIAGNOSIS — D509 Iron deficiency anemia, unspecified: Secondary | ICD-10-CM | POA: Diagnosis not present

## 2023-08-16 DIAGNOSIS — I10 Essential (primary) hypertension: Secondary | ICD-10-CM | POA: Diagnosis not present

## 2023-08-19 DIAGNOSIS — J9610 Chronic respiratory failure, unspecified whether with hypoxia or hypercapnia: Secondary | ICD-10-CM | POA: Diagnosis not present

## 2023-08-19 DIAGNOSIS — J309 Allergic rhinitis, unspecified: Secondary | ICD-10-CM | POA: Diagnosis not present

## 2023-08-19 DIAGNOSIS — E538 Deficiency of other specified B group vitamins: Secondary | ICD-10-CM | POA: Diagnosis not present

## 2023-08-19 DIAGNOSIS — I10 Essential (primary) hypertension: Secondary | ICD-10-CM | POA: Diagnosis not present

## 2023-08-19 DIAGNOSIS — D509 Iron deficiency anemia, unspecified: Secondary | ICD-10-CM | POA: Diagnosis not present

## 2023-08-19 DIAGNOSIS — F411 Generalized anxiety disorder: Secondary | ICD-10-CM | POA: Diagnosis not present

## 2023-08-19 DIAGNOSIS — J439 Emphysema, unspecified: Secondary | ICD-10-CM | POA: Diagnosis not present

## 2023-08-19 DIAGNOSIS — E041 Nontoxic single thyroid nodule: Secondary | ICD-10-CM | POA: Diagnosis not present

## 2023-08-19 DIAGNOSIS — J441 Chronic obstructive pulmonary disease with (acute) exacerbation: Secondary | ICD-10-CM | POA: Diagnosis not present

## 2023-08-21 DIAGNOSIS — I10 Essential (primary) hypertension: Secondary | ICD-10-CM | POA: Diagnosis not present

## 2023-08-21 DIAGNOSIS — J309 Allergic rhinitis, unspecified: Secondary | ICD-10-CM | POA: Diagnosis not present

## 2023-08-21 DIAGNOSIS — J9610 Chronic respiratory failure, unspecified whether with hypoxia or hypercapnia: Secondary | ICD-10-CM | POA: Diagnosis not present

## 2023-08-21 DIAGNOSIS — J439 Emphysema, unspecified: Secondary | ICD-10-CM | POA: Diagnosis not present

## 2023-08-21 DIAGNOSIS — E041 Nontoxic single thyroid nodule: Secondary | ICD-10-CM | POA: Diagnosis not present

## 2023-08-21 DIAGNOSIS — J441 Chronic obstructive pulmonary disease with (acute) exacerbation: Secondary | ICD-10-CM | POA: Diagnosis not present

## 2023-08-21 DIAGNOSIS — D509 Iron deficiency anemia, unspecified: Secondary | ICD-10-CM | POA: Diagnosis not present

## 2023-08-21 DIAGNOSIS — E538 Deficiency of other specified B group vitamins: Secondary | ICD-10-CM | POA: Diagnosis not present

## 2023-08-21 DIAGNOSIS — F411 Generalized anxiety disorder: Secondary | ICD-10-CM | POA: Diagnosis not present

## 2023-08-22 DIAGNOSIS — J309 Allergic rhinitis, unspecified: Secondary | ICD-10-CM | POA: Diagnosis not present

## 2023-08-22 DIAGNOSIS — E041 Nontoxic single thyroid nodule: Secondary | ICD-10-CM | POA: Diagnosis not present

## 2023-08-22 DIAGNOSIS — J441 Chronic obstructive pulmonary disease with (acute) exacerbation: Secondary | ICD-10-CM | POA: Diagnosis not present

## 2023-08-22 DIAGNOSIS — I10 Essential (primary) hypertension: Secondary | ICD-10-CM | POA: Diagnosis not present

## 2023-08-22 DIAGNOSIS — F411 Generalized anxiety disorder: Secondary | ICD-10-CM | POA: Diagnosis not present

## 2023-08-22 DIAGNOSIS — E538 Deficiency of other specified B group vitamins: Secondary | ICD-10-CM | POA: Diagnosis not present

## 2023-08-22 DIAGNOSIS — D509 Iron deficiency anemia, unspecified: Secondary | ICD-10-CM | POA: Diagnosis not present

## 2023-08-22 DIAGNOSIS — J439 Emphysema, unspecified: Secondary | ICD-10-CM | POA: Diagnosis not present

## 2023-08-22 DIAGNOSIS — J9610 Chronic respiratory failure, unspecified whether with hypoxia or hypercapnia: Secondary | ICD-10-CM | POA: Diagnosis not present

## 2023-08-27 DIAGNOSIS — I10 Essential (primary) hypertension: Secondary | ICD-10-CM | POA: Diagnosis not present

## 2023-08-27 DIAGNOSIS — J441 Chronic obstructive pulmonary disease with (acute) exacerbation: Secondary | ICD-10-CM | POA: Diagnosis not present

## 2023-08-27 DIAGNOSIS — E041 Nontoxic single thyroid nodule: Secondary | ICD-10-CM | POA: Diagnosis not present

## 2023-08-27 DIAGNOSIS — J9610 Chronic respiratory failure, unspecified whether with hypoxia or hypercapnia: Secondary | ICD-10-CM | POA: Diagnosis not present

## 2023-08-27 DIAGNOSIS — J439 Emphysema, unspecified: Secondary | ICD-10-CM | POA: Diagnosis not present

## 2023-08-27 DIAGNOSIS — J309 Allergic rhinitis, unspecified: Secondary | ICD-10-CM | POA: Diagnosis not present

## 2023-08-27 DIAGNOSIS — E538 Deficiency of other specified B group vitamins: Secondary | ICD-10-CM | POA: Diagnosis not present

## 2023-08-27 DIAGNOSIS — D509 Iron deficiency anemia, unspecified: Secondary | ICD-10-CM | POA: Diagnosis not present

## 2023-08-27 DIAGNOSIS — F411 Generalized anxiety disorder: Secondary | ICD-10-CM | POA: Diagnosis not present

## 2023-08-30 DIAGNOSIS — F411 Generalized anxiety disorder: Secondary | ICD-10-CM | POA: Diagnosis not present

## 2023-08-30 DIAGNOSIS — I10 Essential (primary) hypertension: Secondary | ICD-10-CM | POA: Diagnosis not present

## 2023-08-30 DIAGNOSIS — D509 Iron deficiency anemia, unspecified: Secondary | ICD-10-CM | POA: Diagnosis not present

## 2023-08-30 DIAGNOSIS — E538 Deficiency of other specified B group vitamins: Secondary | ICD-10-CM | POA: Diagnosis not present

## 2023-08-30 DIAGNOSIS — J9610 Chronic respiratory failure, unspecified whether with hypoxia or hypercapnia: Secondary | ICD-10-CM | POA: Diagnosis not present

## 2023-08-30 DIAGNOSIS — J441 Chronic obstructive pulmonary disease with (acute) exacerbation: Secondary | ICD-10-CM | POA: Diagnosis not present

## 2023-08-30 DIAGNOSIS — J309 Allergic rhinitis, unspecified: Secondary | ICD-10-CM | POA: Diagnosis not present

## 2023-08-30 DIAGNOSIS — E041 Nontoxic single thyroid nodule: Secondary | ICD-10-CM | POA: Diagnosis not present

## 2023-08-30 DIAGNOSIS — J439 Emphysema, unspecified: Secondary | ICD-10-CM | POA: Diagnosis not present

## 2023-09-04 DIAGNOSIS — J441 Chronic obstructive pulmonary disease with (acute) exacerbation: Secondary | ICD-10-CM | POA: Diagnosis not present

## 2023-09-04 DIAGNOSIS — F411 Generalized anxiety disorder: Secondary | ICD-10-CM | POA: Diagnosis not present

## 2023-09-04 DIAGNOSIS — J309 Allergic rhinitis, unspecified: Secondary | ICD-10-CM | POA: Diagnosis not present

## 2023-09-04 DIAGNOSIS — E041 Nontoxic single thyroid nodule: Secondary | ICD-10-CM | POA: Diagnosis not present

## 2023-09-04 DIAGNOSIS — E538 Deficiency of other specified B group vitamins: Secondary | ICD-10-CM | POA: Diagnosis not present

## 2023-09-04 DIAGNOSIS — I10 Essential (primary) hypertension: Secondary | ICD-10-CM | POA: Diagnosis not present

## 2023-09-04 DIAGNOSIS — D509 Iron deficiency anemia, unspecified: Secondary | ICD-10-CM | POA: Diagnosis not present

## 2023-09-04 DIAGNOSIS — J9610 Chronic respiratory failure, unspecified whether with hypoxia or hypercapnia: Secondary | ICD-10-CM | POA: Diagnosis not present

## 2023-09-04 DIAGNOSIS — J439 Emphysema, unspecified: Secondary | ICD-10-CM | POA: Diagnosis not present

## 2023-09-05 DIAGNOSIS — J309 Allergic rhinitis, unspecified: Secondary | ICD-10-CM | POA: Diagnosis not present

## 2023-09-05 DIAGNOSIS — I10 Essential (primary) hypertension: Secondary | ICD-10-CM | POA: Diagnosis not present

## 2023-09-05 DIAGNOSIS — J439 Emphysema, unspecified: Secondary | ICD-10-CM | POA: Diagnosis not present

## 2023-09-05 DIAGNOSIS — F411 Generalized anxiety disorder: Secondary | ICD-10-CM | POA: Diagnosis not present

## 2023-09-05 DIAGNOSIS — J9610 Chronic respiratory failure, unspecified whether with hypoxia or hypercapnia: Secondary | ICD-10-CM | POA: Diagnosis not present

## 2023-09-05 DIAGNOSIS — E041 Nontoxic single thyroid nodule: Secondary | ICD-10-CM | POA: Diagnosis not present

## 2023-09-05 DIAGNOSIS — J441 Chronic obstructive pulmonary disease with (acute) exacerbation: Secondary | ICD-10-CM | POA: Diagnosis not present

## 2023-09-05 DIAGNOSIS — D509 Iron deficiency anemia, unspecified: Secondary | ICD-10-CM | POA: Diagnosis not present

## 2023-09-05 DIAGNOSIS — E538 Deficiency of other specified B group vitamins: Secondary | ICD-10-CM | POA: Diagnosis not present

## 2023-09-08 ENCOUNTER — Other Ambulatory Visit: Payer: Self-pay | Admitting: Internal Medicine

## 2023-09-08 DIAGNOSIS — J441 Chronic obstructive pulmonary disease with (acute) exacerbation: Secondary | ICD-10-CM

## 2023-09-12 DIAGNOSIS — J9610 Chronic respiratory failure, unspecified whether with hypoxia or hypercapnia: Secondary | ICD-10-CM | POA: Diagnosis not present

## 2023-09-12 DIAGNOSIS — J309 Allergic rhinitis, unspecified: Secondary | ICD-10-CM | POA: Diagnosis not present

## 2023-09-12 DIAGNOSIS — J441 Chronic obstructive pulmonary disease with (acute) exacerbation: Secondary | ICD-10-CM | POA: Diagnosis not present

## 2023-09-12 DIAGNOSIS — D509 Iron deficiency anemia, unspecified: Secondary | ICD-10-CM | POA: Diagnosis not present

## 2023-09-12 DIAGNOSIS — E041 Nontoxic single thyroid nodule: Secondary | ICD-10-CM | POA: Diagnosis not present

## 2023-09-12 DIAGNOSIS — F411 Generalized anxiety disorder: Secondary | ICD-10-CM | POA: Diagnosis not present

## 2023-09-12 DIAGNOSIS — I10 Essential (primary) hypertension: Secondary | ICD-10-CM | POA: Diagnosis not present

## 2023-09-12 DIAGNOSIS — J439 Emphysema, unspecified: Secondary | ICD-10-CM | POA: Diagnosis not present

## 2023-09-12 DIAGNOSIS — E538 Deficiency of other specified B group vitamins: Secondary | ICD-10-CM | POA: Diagnosis not present

## 2023-09-17 DIAGNOSIS — E538 Deficiency of other specified B group vitamins: Secondary | ICD-10-CM | POA: Diagnosis not present

## 2023-09-17 DIAGNOSIS — J441 Chronic obstructive pulmonary disease with (acute) exacerbation: Secondary | ICD-10-CM | POA: Diagnosis not present

## 2023-09-17 DIAGNOSIS — J309 Allergic rhinitis, unspecified: Secondary | ICD-10-CM | POA: Diagnosis not present

## 2023-09-17 DIAGNOSIS — E041 Nontoxic single thyroid nodule: Secondary | ICD-10-CM | POA: Diagnosis not present

## 2023-09-17 DIAGNOSIS — I10 Essential (primary) hypertension: Secondary | ICD-10-CM | POA: Diagnosis not present

## 2023-09-17 DIAGNOSIS — J439 Emphysema, unspecified: Secondary | ICD-10-CM | POA: Diagnosis not present

## 2023-09-17 DIAGNOSIS — F411 Generalized anxiety disorder: Secondary | ICD-10-CM | POA: Diagnosis not present

## 2023-09-17 DIAGNOSIS — D509 Iron deficiency anemia, unspecified: Secondary | ICD-10-CM | POA: Diagnosis not present

## 2023-09-17 DIAGNOSIS — J9610 Chronic respiratory failure, unspecified whether with hypoxia or hypercapnia: Secondary | ICD-10-CM | POA: Diagnosis not present

## 2023-09-18 DIAGNOSIS — D509 Iron deficiency anemia, unspecified: Secondary | ICD-10-CM | POA: Diagnosis not present

## 2023-09-18 DIAGNOSIS — J309 Allergic rhinitis, unspecified: Secondary | ICD-10-CM | POA: Diagnosis not present

## 2023-09-18 DIAGNOSIS — F411 Generalized anxiety disorder: Secondary | ICD-10-CM | POA: Diagnosis not present

## 2023-09-18 DIAGNOSIS — J441 Chronic obstructive pulmonary disease with (acute) exacerbation: Secondary | ICD-10-CM | POA: Diagnosis not present

## 2023-09-18 DIAGNOSIS — E538 Deficiency of other specified B group vitamins: Secondary | ICD-10-CM | POA: Diagnosis not present

## 2023-09-18 DIAGNOSIS — J9610 Chronic respiratory failure, unspecified whether with hypoxia or hypercapnia: Secondary | ICD-10-CM | POA: Diagnosis not present

## 2023-09-18 DIAGNOSIS — E041 Nontoxic single thyroid nodule: Secondary | ICD-10-CM | POA: Diagnosis not present

## 2023-09-18 DIAGNOSIS — J439 Emphysema, unspecified: Secondary | ICD-10-CM | POA: Diagnosis not present

## 2023-09-18 DIAGNOSIS — I10 Essential (primary) hypertension: Secondary | ICD-10-CM | POA: Diagnosis not present

## 2023-09-25 DIAGNOSIS — E538 Deficiency of other specified B group vitamins: Secondary | ICD-10-CM | POA: Diagnosis not present

## 2023-09-25 DIAGNOSIS — J441 Chronic obstructive pulmonary disease with (acute) exacerbation: Secondary | ICD-10-CM | POA: Diagnosis not present

## 2023-09-25 DIAGNOSIS — I10 Essential (primary) hypertension: Secondary | ICD-10-CM | POA: Diagnosis not present

## 2023-09-25 DIAGNOSIS — E041 Nontoxic single thyroid nodule: Secondary | ICD-10-CM | POA: Diagnosis not present

## 2023-09-25 DIAGNOSIS — J439 Emphysema, unspecified: Secondary | ICD-10-CM | POA: Diagnosis not present

## 2023-09-25 DIAGNOSIS — F411 Generalized anxiety disorder: Secondary | ICD-10-CM | POA: Diagnosis not present

## 2023-09-25 DIAGNOSIS — D509 Iron deficiency anemia, unspecified: Secondary | ICD-10-CM | POA: Diagnosis not present

## 2023-09-25 DIAGNOSIS — J309 Allergic rhinitis, unspecified: Secondary | ICD-10-CM | POA: Diagnosis not present

## 2023-09-25 DIAGNOSIS — J9610 Chronic respiratory failure, unspecified whether with hypoxia or hypercapnia: Secondary | ICD-10-CM | POA: Diagnosis not present

## 2023-10-13 ENCOUNTER — Other Ambulatory Visit: Payer: Self-pay | Admitting: Internal Medicine

## 2023-10-13 ENCOUNTER — Other Ambulatory Visit: Payer: Self-pay

## 2023-10-13 DIAGNOSIS — J441 Chronic obstructive pulmonary disease with (acute) exacerbation: Secondary | ICD-10-CM

## 2023-10-13 MED ORDER — ALBUTEROL SULFATE HFA 108 (90 BASE) MCG/ACT IN AERS: 1.0000 | INHALATION_SPRAY | RESPIRATORY_TRACT | 2 refills | Status: AC | PRN

## 2023-10-13 NOTE — Progress Notes (Signed)
Rx Refill

## 2023-10-18 DIAGNOSIS — R000 Tachycardia, unspecified: Secondary | ICD-10-CM | POA: Diagnosis not present

## 2023-10-18 DIAGNOSIS — Z743 Need for continuous supervision: Secondary | ICD-10-CM | POA: Diagnosis not present

## 2023-10-18 DIAGNOSIS — I10 Essential (primary) hypertension: Secondary | ICD-10-CM | POA: Diagnosis not present

## 2023-10-18 DIAGNOSIS — R457 State of emotional shock and stress, unspecified: Secondary | ICD-10-CM | POA: Diagnosis not present

## 2023-10-18 DIAGNOSIS — J441 Chronic obstructive pulmonary disease with (acute) exacerbation: Secondary | ICD-10-CM | POA: Diagnosis not present

## 2023-10-18 DIAGNOSIS — R0602 Shortness of breath: Secondary | ICD-10-CM | POA: Diagnosis not present

## 2023-10-29 DIAGNOSIS — W19XXXA Unspecified fall, initial encounter: Secondary | ICD-10-CM | POA: Diagnosis not present

## 2023-10-29 DIAGNOSIS — I2781 Cor pulmonale (chronic): Secondary | ICD-10-CM | POA: Diagnosis not present

## 2023-10-29 DIAGNOSIS — J189 Pneumonia, unspecified organism: Secondary | ICD-10-CM | POA: Diagnosis not present

## 2023-10-29 DIAGNOSIS — Z7401 Bed confinement status: Secondary | ICD-10-CM | POA: Diagnosis not present

## 2023-10-29 DIAGNOSIS — R000 Tachycardia, unspecified: Secondary | ICD-10-CM | POA: Diagnosis not present

## 2023-10-29 DIAGNOSIS — J441 Chronic obstructive pulmonary disease with (acute) exacerbation: Secondary | ICD-10-CM | POA: Diagnosis not present

## 2023-10-29 DIAGNOSIS — I10 Essential (primary) hypertension: Secondary | ICD-10-CM | POA: Diagnosis not present

## 2023-10-29 DIAGNOSIS — R531 Weakness: Secondary | ICD-10-CM | POA: Diagnosis not present

## 2023-10-29 DIAGNOSIS — E538 Deficiency of other specified B group vitamins: Secondary | ICD-10-CM | POA: Diagnosis not present

## 2023-10-29 DIAGNOSIS — E876 Hypokalemia: Secondary | ICD-10-CM | POA: Diagnosis not present

## 2023-10-29 DIAGNOSIS — J9621 Acute and chronic respiratory failure with hypoxia: Secondary | ICD-10-CM | POA: Diagnosis not present

## 2023-10-29 DIAGNOSIS — J440 Chronic obstructive pulmonary disease with acute lower respiratory infection: Secondary | ICD-10-CM | POA: Diagnosis not present

## 2023-10-29 DIAGNOSIS — M1990 Unspecified osteoarthritis, unspecified site: Secondary | ICD-10-CM | POA: Diagnosis not present

## 2023-10-29 DIAGNOSIS — R918 Other nonspecific abnormal finding of lung field: Secondary | ICD-10-CM | POA: Diagnosis not present

## 2023-10-29 DIAGNOSIS — R0602 Shortness of breath: Secondary | ICD-10-CM | POA: Diagnosis not present

## 2023-10-30 DIAGNOSIS — J9621 Acute and chronic respiratory failure with hypoxia: Secondary | ICD-10-CM | POA: Diagnosis not present

## 2023-10-30 DIAGNOSIS — J440 Chronic obstructive pulmonary disease with acute lower respiratory infection: Secondary | ICD-10-CM | POA: Diagnosis not present

## 2023-10-30 DIAGNOSIS — J189 Pneumonia, unspecified organism: Secondary | ICD-10-CM | POA: Diagnosis not present

## 2023-10-31 DIAGNOSIS — J9621 Acute and chronic respiratory failure with hypoxia: Secondary | ICD-10-CM | POA: Diagnosis not present

## 2023-10-31 DIAGNOSIS — J440 Chronic obstructive pulmonary disease with acute lower respiratory infection: Secondary | ICD-10-CM | POA: Diagnosis not present

## 2023-10-31 DIAGNOSIS — J189 Pneumonia, unspecified organism: Secondary | ICD-10-CM | POA: Diagnosis not present

## 2023-11-01 DIAGNOSIS — J9621 Acute and chronic respiratory failure with hypoxia: Secondary | ICD-10-CM | POA: Diagnosis not present

## 2023-11-01 DIAGNOSIS — R531 Weakness: Secondary | ICD-10-CM | POA: Diagnosis not present

## 2023-11-01 DIAGNOSIS — Z7401 Bed confinement status: Secondary | ICD-10-CM | POA: Diagnosis not present

## 2023-11-01 DIAGNOSIS — J440 Chronic obstructive pulmonary disease with acute lower respiratory infection: Secondary | ICD-10-CM | POA: Diagnosis not present

## 2023-11-01 DIAGNOSIS — J189 Pneumonia, unspecified organism: Secondary | ICD-10-CM | POA: Diagnosis not present

## 2023-11-07 ENCOUNTER — Other Ambulatory Visit: Payer: Self-pay | Admitting: Internal Medicine

## 2023-11-10 DIAGNOSIS — R4189 Other symptoms and signs involving cognitive functions and awareness: Secondary | ICD-10-CM | POA: Diagnosis not present

## 2023-11-10 DIAGNOSIS — D509 Iron deficiency anemia, unspecified: Secondary | ICD-10-CM | POA: Diagnosis not present

## 2023-11-10 DIAGNOSIS — K219 Gastro-esophageal reflux disease without esophagitis: Secondary | ICD-10-CM | POA: Diagnosis not present

## 2023-11-10 DIAGNOSIS — D519 Vitamin B12 deficiency anemia, unspecified: Secondary | ICD-10-CM | POA: Diagnosis not present

## 2023-11-10 DIAGNOSIS — J9611 Chronic respiratory failure with hypoxia: Secondary | ICD-10-CM | POA: Diagnosis not present

## 2023-11-10 DIAGNOSIS — R0602 Shortness of breath: Secondary | ICD-10-CM | POA: Diagnosis not present

## 2023-11-10 DIAGNOSIS — J441 Chronic obstructive pulmonary disease with (acute) exacerbation: Secondary | ICD-10-CM | POA: Diagnosis not present

## 2023-11-10 DIAGNOSIS — Z7401 Bed confinement status: Secondary | ICD-10-CM | POA: Diagnosis not present

## 2023-11-10 DIAGNOSIS — E872 Acidosis, unspecified: Secondary | ICD-10-CM | POA: Diagnosis not present

## 2023-11-10 DIAGNOSIS — R7989 Other specified abnormal findings of blood chemistry: Secondary | ICD-10-CM | POA: Diagnosis not present

## 2023-11-10 DIAGNOSIS — R488 Other symbolic dysfunctions: Secondary | ICD-10-CM | POA: Diagnosis not present

## 2023-11-10 DIAGNOSIS — R531 Weakness: Secondary | ICD-10-CM | POA: Diagnosis not present

## 2023-11-10 DIAGNOSIS — R918 Other nonspecific abnormal finding of lung field: Secondary | ICD-10-CM | POA: Diagnosis not present

## 2023-11-10 DIAGNOSIS — J9621 Acute and chronic respiratory failure with hypoxia: Secondary | ICD-10-CM | POA: Diagnosis not present

## 2023-11-10 DIAGNOSIS — R062 Wheezing: Secondary | ICD-10-CM | POA: Diagnosis not present

## 2023-11-10 DIAGNOSIS — F03A0 Unspecified dementia, mild, without behavioral disturbance, psychotic disturbance, mood disturbance, and anxiety: Secondary | ICD-10-CM | POA: Diagnosis not present

## 2023-11-10 DIAGNOSIS — F32A Depression, unspecified: Secondary | ICD-10-CM | POA: Diagnosis not present

## 2023-11-10 DIAGNOSIS — R000 Tachycardia, unspecified: Secondary | ICD-10-CM | POA: Diagnosis not present

## 2023-11-10 DIAGNOSIS — I10 Essential (primary) hypertension: Secondary | ICD-10-CM | POA: Diagnosis not present

## 2023-11-10 DIAGNOSIS — M1990 Unspecified osteoarthritis, unspecified site: Secondary | ICD-10-CM | POA: Diagnosis not present

## 2023-11-10 DIAGNOSIS — G4489 Other headache syndrome: Secondary | ICD-10-CM | POA: Diagnosis not present

## 2023-11-10 DIAGNOSIS — F419 Anxiety disorder, unspecified: Secondary | ICD-10-CM | POA: Diagnosis not present

## 2023-11-17 ENCOUNTER — Telehealth: Payer: Medicare HMO | Admitting: Internal Medicine

## 2023-11-17 DIAGNOSIS — R4189 Other symptoms and signs involving cognitive functions and awareness: Secondary | ICD-10-CM | POA: Diagnosis not present

## 2023-11-17 DIAGNOSIS — M1990 Unspecified osteoarthritis, unspecified site: Secondary | ICD-10-CM | POA: Diagnosis not present

## 2023-11-17 DIAGNOSIS — J441 Chronic obstructive pulmonary disease with (acute) exacerbation: Secondary | ICD-10-CM | POA: Diagnosis not present

## 2023-11-17 DIAGNOSIS — R112 Nausea with vomiting, unspecified: Secondary | ICD-10-CM | POA: Diagnosis not present

## 2023-11-17 DIAGNOSIS — R000 Tachycardia, unspecified: Secondary | ICD-10-CM | POA: Diagnosis not present

## 2023-11-17 DIAGNOSIS — E872 Acidosis, unspecified: Secondary | ICD-10-CM | POA: Diagnosis not present

## 2023-11-17 DIAGNOSIS — F03A0 Unspecified dementia, mild, without behavioral disturbance, psychotic disturbance, mood disturbance, and anxiety: Secondary | ICD-10-CM | POA: Diagnosis not present

## 2023-11-17 DIAGNOSIS — R531 Weakness: Secondary | ICD-10-CM | POA: Diagnosis not present

## 2023-11-17 DIAGNOSIS — F02A0 Dementia in other diseases classified elsewhere, mild, without behavioral disturbance, psychotic disturbance, mood disturbance, and anxiety: Secondary | ICD-10-CM | POA: Diagnosis not present

## 2023-11-17 DIAGNOSIS — F32A Depression, unspecified: Secondary | ICD-10-CM | POA: Diagnosis not present

## 2023-11-17 DIAGNOSIS — A0811 Acute gastroenteropathy due to Norwalk agent: Secondary | ICD-10-CM | POA: Diagnosis not present

## 2023-11-17 DIAGNOSIS — R197 Diarrhea, unspecified: Secondary | ICD-10-CM | POA: Diagnosis not present

## 2023-11-17 DIAGNOSIS — R488 Other symbolic dysfunctions: Secondary | ICD-10-CM | POA: Diagnosis not present

## 2023-11-17 DIAGNOSIS — K219 Gastro-esophageal reflux disease without esophagitis: Secondary | ICD-10-CM | POA: Diagnosis not present

## 2023-11-17 DIAGNOSIS — F419 Anxiety disorder, unspecified: Secondary | ICD-10-CM | POA: Diagnosis not present

## 2023-11-17 DIAGNOSIS — J9811 Atelectasis: Secondary | ICD-10-CM | POA: Diagnosis not present

## 2023-11-17 DIAGNOSIS — J9621 Acute and chronic respiratory failure with hypoxia: Secondary | ICD-10-CM | POA: Diagnosis not present

## 2023-11-17 DIAGNOSIS — J9611 Chronic respiratory failure with hypoxia: Secondary | ICD-10-CM | POA: Diagnosis not present

## 2023-11-17 DIAGNOSIS — D509 Iron deficiency anemia, unspecified: Secondary | ICD-10-CM | POA: Diagnosis not present

## 2023-11-17 DIAGNOSIS — D519 Vitamin B12 deficiency anemia, unspecified: Secondary | ICD-10-CM | POA: Diagnosis not present

## 2023-11-17 DIAGNOSIS — I10 Essential (primary) hypertension: Secondary | ICD-10-CM | POA: Diagnosis not present

## 2023-11-17 DIAGNOSIS — R799 Abnormal finding of blood chemistry, unspecified: Secondary | ICD-10-CM | POA: Diagnosis not present

## 2023-11-17 DIAGNOSIS — Z7401 Bed confinement status: Secondary | ICD-10-CM | POA: Diagnosis not present

## 2023-11-18 DIAGNOSIS — J9611 Chronic respiratory failure with hypoxia: Secondary | ICD-10-CM | POA: Diagnosis not present

## 2023-11-18 DIAGNOSIS — J441 Chronic obstructive pulmonary disease with (acute) exacerbation: Secondary | ICD-10-CM | POA: Diagnosis not present

## 2023-11-18 DIAGNOSIS — F02A0 Dementia in other diseases classified elsewhere, mild, without behavioral disturbance, psychotic disturbance, mood disturbance, and anxiety: Secondary | ICD-10-CM | POA: Diagnosis not present

## 2023-11-18 DIAGNOSIS — I10 Essential (primary) hypertension: Secondary | ICD-10-CM | POA: Diagnosis not present

## 2023-11-21 DIAGNOSIS — R799 Abnormal finding of blood chemistry, unspecified: Secondary | ICD-10-CM | POA: Diagnosis not present

## 2023-11-24 DIAGNOSIS — I10 Essential (primary) hypertension: Secondary | ICD-10-CM | POA: Diagnosis not present

## 2023-11-24 DIAGNOSIS — J441 Chronic obstructive pulmonary disease with (acute) exacerbation: Secondary | ICD-10-CM | POA: Diagnosis not present

## 2023-11-24 DIAGNOSIS — M1990 Unspecified osteoarthritis, unspecified site: Secondary | ICD-10-CM | POA: Diagnosis not present

## 2023-11-24 DIAGNOSIS — K219 Gastro-esophageal reflux disease without esophagitis: Secondary | ICD-10-CM | POA: Diagnosis not present

## 2023-11-24 DIAGNOSIS — F419 Anxiety disorder, unspecified: Secondary | ICD-10-CM | POA: Diagnosis not present

## 2023-11-24 DIAGNOSIS — F32A Depression, unspecified: Secondary | ICD-10-CM | POA: Diagnosis not present

## 2023-11-24 DIAGNOSIS — J9811 Atelectasis: Secondary | ICD-10-CM | POA: Diagnosis not present

## 2023-11-24 DIAGNOSIS — R112 Nausea with vomiting, unspecified: Secondary | ICD-10-CM | POA: Diagnosis not present

## 2023-11-24 DIAGNOSIS — J9621 Acute and chronic respiratory failure with hypoxia: Secondary | ICD-10-CM | POA: Diagnosis not present

## 2023-11-24 DIAGNOSIS — E872 Acidosis, unspecified: Secondary | ICD-10-CM | POA: Diagnosis not present

## 2023-11-24 DIAGNOSIS — Z759 Unspecified problem related to medical facilities and other health care: Secondary | ICD-10-CM | POA: Diagnosis not present

## 2023-11-24 DIAGNOSIS — A0811 Acute gastroenteropathy due to Norwalk agent: Secondary | ICD-10-CM | POA: Diagnosis not present

## 2023-11-24 DIAGNOSIS — R279 Unspecified lack of coordination: Secondary | ICD-10-CM | POA: Diagnosis not present

## 2023-11-24 DIAGNOSIS — Z7401 Bed confinement status: Secondary | ICD-10-CM | POA: Diagnosis not present

## 2023-11-24 DIAGNOSIS — J449 Chronic obstructive pulmonary disease, unspecified: Secondary | ICD-10-CM | POA: Diagnosis not present

## 2023-11-24 DIAGNOSIS — R197 Diarrhea, unspecified: Secondary | ICD-10-CM | POA: Diagnosis not present

## 2023-11-28 DIAGNOSIS — K219 Gastro-esophageal reflux disease without esophagitis: Secondary | ICD-10-CM | POA: Diagnosis not present

## 2023-11-28 DIAGNOSIS — Z759 Unspecified problem related to medical facilities and other health care: Secondary | ICD-10-CM | POA: Diagnosis not present

## 2023-11-28 DIAGNOSIS — J449 Chronic obstructive pulmonary disease, unspecified: Secondary | ICD-10-CM | POA: Diagnosis not present

## 2023-11-28 DIAGNOSIS — I10 Essential (primary) hypertension: Secondary | ICD-10-CM | POA: Diagnosis not present

## 2023-11-30 DIAGNOSIS — I10 Essential (primary) hypertension: Secondary | ICD-10-CM | POA: Diagnosis not present

## 2023-11-30 DIAGNOSIS — R918 Other nonspecific abnormal finding of lung field: Secondary | ICD-10-CM | POA: Diagnosis not present

## 2023-11-30 DIAGNOSIS — J439 Emphysema, unspecified: Secondary | ICD-10-CM | POA: Diagnosis not present

## 2023-11-30 DIAGNOSIS — Z79899 Other long term (current) drug therapy: Secondary | ICD-10-CM | POA: Diagnosis not present

## 2023-11-30 DIAGNOSIS — R000 Tachycardia, unspecified: Secondary | ICD-10-CM | POA: Diagnosis not present

## 2023-11-30 DIAGNOSIS — R0989 Other specified symptoms and signs involving the circulatory and respiratory systems: Secondary | ICD-10-CM | POA: Diagnosis not present

## 2023-11-30 DIAGNOSIS — K219 Gastro-esophageal reflux disease without esophagitis: Secondary | ICD-10-CM | POA: Diagnosis not present

## 2023-11-30 DIAGNOSIS — R600 Localized edema: Secondary | ICD-10-CM | POA: Diagnosis not present

## 2023-11-30 DIAGNOSIS — I289 Disease of pulmonary vessels, unspecified: Secondary | ICD-10-CM | POA: Diagnosis not present

## 2023-11-30 DIAGNOSIS — R0602 Shortness of breath: Secondary | ICD-10-CM | POA: Diagnosis not present

## 2023-11-30 DIAGNOSIS — F411 Generalized anxiety disorder: Secondary | ICD-10-CM | POA: Diagnosis not present

## 2023-11-30 DIAGNOSIS — Z20822 Contact with and (suspected) exposure to covid-19: Secondary | ICD-10-CM | POA: Diagnosis not present

## 2023-11-30 DIAGNOSIS — Z9981 Dependence on supplemental oxygen: Secondary | ICD-10-CM | POA: Diagnosis not present

## 2023-11-30 DIAGNOSIS — Z87891 Personal history of nicotine dependence: Secondary | ICD-10-CM | POA: Diagnosis not present

## 2023-12-01 DIAGNOSIS — R0689 Other abnormalities of breathing: Secondary | ICD-10-CM | POA: Diagnosis not present

## 2023-12-01 DIAGNOSIS — Z743 Need for continuous supervision: Secondary | ICD-10-CM | POA: Diagnosis not present

## 2023-12-01 DIAGNOSIS — R000 Tachycardia, unspecified: Secondary | ICD-10-CM | POA: Diagnosis not present

## 2023-12-05 DIAGNOSIS — J441 Chronic obstructive pulmonary disease with (acute) exacerbation: Secondary | ICD-10-CM | POA: Diagnosis not present

## 2023-12-05 DIAGNOSIS — R0602 Shortness of breath: Secondary | ICD-10-CM | POA: Diagnosis not present

## 2023-12-05 DIAGNOSIS — R222 Localized swelling, mass and lump, trunk: Secondary | ICD-10-CM | POA: Diagnosis not present

## 2023-12-05 DIAGNOSIS — E041 Nontoxic single thyroid nodule: Secondary | ICD-10-CM | POA: Diagnosis not present

## 2023-12-06 DIAGNOSIS — J99 Respiratory disorders in diseases classified elsewhere: Secondary | ICD-10-CM | POA: Diagnosis not present

## 2023-12-06 DIAGNOSIS — Z7401 Bed confinement status: Secondary | ICD-10-CM | POA: Diagnosis not present

## 2023-12-06 DIAGNOSIS — J441 Chronic obstructive pulmonary disease with (acute) exacerbation: Secondary | ICD-10-CM | POA: Diagnosis not present

## 2023-12-06 DIAGNOSIS — R222 Localized swelling, mass and lump, trunk: Secondary | ICD-10-CM | POA: Diagnosis not present

## 2023-12-06 DIAGNOSIS — M6289 Other specified disorders of muscle: Secondary | ICD-10-CM | POA: Diagnosis not present

## 2023-12-06 DIAGNOSIS — J440 Chronic obstructive pulmonary disease with acute lower respiratory infection: Secondary | ICD-10-CM | POA: Diagnosis not present

## 2023-12-06 DIAGNOSIS — K219 Gastro-esophageal reflux disease without esophagitis: Secondary | ICD-10-CM | POA: Diagnosis not present

## 2023-12-06 DIAGNOSIS — J9621 Acute and chronic respiratory failure with hypoxia: Secondary | ICD-10-CM | POA: Diagnosis not present

## 2023-12-06 DIAGNOSIS — J101 Influenza due to other identified influenza virus with other respiratory manifestations: Secondary | ICD-10-CM | POA: Diagnosis not present

## 2023-12-06 DIAGNOSIS — R600 Localized edema: Secondary | ICD-10-CM | POA: Diagnosis not present

## 2023-12-06 DIAGNOSIS — J209 Acute bronchitis, unspecified: Secondary | ICD-10-CM | POA: Diagnosis not present

## 2023-12-06 DIAGNOSIS — M6259 Muscle wasting and atrophy, not elsewhere classified, multiple sites: Secondary | ICD-10-CM | POA: Diagnosis not present

## 2023-12-06 DIAGNOSIS — M6281 Muscle weakness (generalized): Secondary | ICD-10-CM | POA: Diagnosis not present

## 2023-12-06 DIAGNOSIS — R531 Weakness: Secondary | ICD-10-CM | POA: Diagnosis not present

## 2023-12-06 DIAGNOSIS — R0602 Shortness of breath: Secondary | ICD-10-CM | POA: Diagnosis not present

## 2023-12-06 DIAGNOSIS — R41841 Cognitive communication deficit: Secondary | ICD-10-CM | POA: Diagnosis not present

## 2023-12-06 DIAGNOSIS — M1990 Unspecified osteoarthritis, unspecified site: Secondary | ICD-10-CM | POA: Diagnosis not present

## 2023-12-06 DIAGNOSIS — E041 Nontoxic single thyroid nodule: Secondary | ICD-10-CM | POA: Diagnosis not present

## 2023-12-06 DIAGNOSIS — I10 Essential (primary) hypertension: Secondary | ICD-10-CM | POA: Diagnosis not present

## 2023-12-06 DIAGNOSIS — R296 Repeated falls: Secondary | ICD-10-CM | POA: Diagnosis not present

## 2023-12-14 DIAGNOSIS — R000 Tachycardia, unspecified: Secondary | ICD-10-CM | POA: Diagnosis not present

## 2023-12-14 DIAGNOSIS — Z7401 Bed confinement status: Secondary | ICD-10-CM | POA: Diagnosis not present

## 2023-12-14 DIAGNOSIS — I10 Essential (primary) hypertension: Secondary | ICD-10-CM | POA: Diagnosis not present

## 2023-12-14 DIAGNOSIS — R0602 Shortness of breath: Secondary | ICD-10-CM | POA: Diagnosis not present

## 2023-12-14 DIAGNOSIS — R531 Weakness: Secondary | ICD-10-CM | POA: Diagnosis not present

## 2023-12-14 DIAGNOSIS — J99 Respiratory disorders in diseases classified elsewhere: Secondary | ICD-10-CM | POA: Diagnosis not present

## 2023-12-14 DIAGNOSIS — U071 COVID-19: Secondary | ICD-10-CM | POA: Diagnosis not present

## 2023-12-14 DIAGNOSIS — J101 Influenza due to other identified influenza virus with other respiratory manifestations: Secondary | ICD-10-CM | POA: Diagnosis not present

## 2023-12-14 DIAGNOSIS — M6281 Muscle weakness (generalized): Secondary | ICD-10-CM | POA: Diagnosis not present

## 2023-12-14 DIAGNOSIS — J441 Chronic obstructive pulmonary disease with (acute) exacerbation: Secondary | ICD-10-CM | POA: Diagnosis not present

## 2023-12-14 DIAGNOSIS — K648 Other hemorrhoids: Secondary | ICD-10-CM | POA: Diagnosis not present

## 2023-12-14 DIAGNOSIS — F411 Generalized anxiety disorder: Secondary | ICD-10-CM | POA: Diagnosis not present

## 2023-12-14 DIAGNOSIS — R41841 Cognitive communication deficit: Secondary | ICD-10-CM | POA: Diagnosis not present

## 2023-12-14 DIAGNOSIS — M6289 Other specified disorders of muscle: Secondary | ICD-10-CM | POA: Diagnosis not present

## 2023-12-14 DIAGNOSIS — R296 Repeated falls: Secondary | ICD-10-CM | POA: Diagnosis not present

## 2023-12-14 DIAGNOSIS — F330 Major depressive disorder, recurrent, mild: Secondary | ICD-10-CM | POA: Diagnosis not present

## 2023-12-14 DIAGNOSIS — M6259 Muscle wasting and atrophy, not elsewhere classified, multiple sites: Secondary | ICD-10-CM | POA: Diagnosis not present

## 2023-12-14 DIAGNOSIS — J9621 Acute and chronic respiratory failure with hypoxia: Secondary | ICD-10-CM | POA: Diagnosis not present

## 2023-12-15 DIAGNOSIS — J9621 Acute and chronic respiratory failure with hypoxia: Secondary | ICD-10-CM | POA: Diagnosis not present

## 2023-12-15 DIAGNOSIS — R000 Tachycardia, unspecified: Secondary | ICD-10-CM | POA: Diagnosis not present

## 2023-12-15 DIAGNOSIS — F411 Generalized anxiety disorder: Secondary | ICD-10-CM | POA: Diagnosis not present

## 2023-12-15 DIAGNOSIS — J101 Influenza due to other identified influenza virus with other respiratory manifestations: Secondary | ICD-10-CM | POA: Diagnosis not present

## 2023-12-15 DIAGNOSIS — I10 Essential (primary) hypertension: Secondary | ICD-10-CM | POA: Diagnosis not present

## 2023-12-15 DIAGNOSIS — J441 Chronic obstructive pulmonary disease with (acute) exacerbation: Secondary | ICD-10-CM | POA: Diagnosis not present

## 2023-12-18 DIAGNOSIS — I10 Essential (primary) hypertension: Secondary | ICD-10-CM | POA: Diagnosis not present

## 2023-12-18 DIAGNOSIS — J441 Chronic obstructive pulmonary disease with (acute) exacerbation: Secondary | ICD-10-CM | POA: Diagnosis not present

## 2023-12-18 DIAGNOSIS — J9621 Acute and chronic respiratory failure with hypoxia: Secondary | ICD-10-CM | POA: Diagnosis not present

## 2023-12-18 DIAGNOSIS — R296 Repeated falls: Secondary | ICD-10-CM | POA: Diagnosis not present

## 2023-12-29 DIAGNOSIS — J441 Chronic obstructive pulmonary disease with (acute) exacerbation: Secondary | ICD-10-CM | POA: Diagnosis not present

## 2023-12-29 DIAGNOSIS — K648 Other hemorrhoids: Secondary | ICD-10-CM | POA: Diagnosis not present

## 2023-12-29 DIAGNOSIS — J9621 Acute and chronic respiratory failure with hypoxia: Secondary | ICD-10-CM | POA: Diagnosis not present

## 2023-12-29 DIAGNOSIS — U071 COVID-19: Secondary | ICD-10-CM | POA: Diagnosis not present

## 2023-12-30 DIAGNOSIS — F330 Major depressive disorder, recurrent, mild: Secondary | ICD-10-CM | POA: Diagnosis not present

## 2024-01-05 DIAGNOSIS — K648 Other hemorrhoids: Secondary | ICD-10-CM | POA: Diagnosis not present

## 2024-01-05 DIAGNOSIS — F411 Generalized anxiety disorder: Secondary | ICD-10-CM | POA: Diagnosis not present

## 2024-01-05 DIAGNOSIS — J441 Chronic obstructive pulmonary disease with (acute) exacerbation: Secondary | ICD-10-CM | POA: Diagnosis not present

## 2024-01-05 DIAGNOSIS — R000 Tachycardia, unspecified: Secondary | ICD-10-CM | POA: Diagnosis not present

## 2024-01-05 DIAGNOSIS — I10 Essential (primary) hypertension: Secondary | ICD-10-CM | POA: Diagnosis not present

## 2024-01-05 DIAGNOSIS — U071 COVID-19: Secondary | ICD-10-CM | POA: Diagnosis not present

## 2024-01-05 DIAGNOSIS — J9621 Acute and chronic respiratory failure with hypoxia: Secondary | ICD-10-CM | POA: Diagnosis not present

## 2024-01-06 DIAGNOSIS — R062 Wheezing: Secondary | ICD-10-CM | POA: Diagnosis not present

## 2024-01-06 DIAGNOSIS — F0390 Unspecified dementia without behavioral disturbance: Secondary | ICD-10-CM | POA: Diagnosis not present

## 2024-01-06 DIAGNOSIS — R059 Cough, unspecified: Secondary | ICD-10-CM | POA: Diagnosis not present

## 2024-01-06 DIAGNOSIS — R000 Tachycardia, unspecified: Secondary | ICD-10-CM | POA: Diagnosis not present

## 2024-01-06 DIAGNOSIS — U071 COVID-19: Secondary | ICD-10-CM | POA: Diagnosis not present

## 2024-01-06 DIAGNOSIS — R079 Chest pain, unspecified: Secondary | ICD-10-CM | POA: Diagnosis not present

## 2024-01-06 DIAGNOSIS — R918 Other nonspecific abnormal finding of lung field: Secondary | ICD-10-CM | POA: Diagnosis not present

## 2024-01-06 DIAGNOSIS — J9611 Chronic respiratory failure with hypoxia: Secondary | ICD-10-CM | POA: Diagnosis not present

## 2024-01-06 DIAGNOSIS — I10 Essential (primary) hypertension: Secondary | ICD-10-CM | POA: Diagnosis not present

## 2024-01-07 DIAGNOSIS — F419 Anxiety disorder, unspecified: Secondary | ICD-10-CM | POA: Diagnosis not present

## 2024-01-07 DIAGNOSIS — I4510 Unspecified right bundle-branch block: Secondary | ICD-10-CM | POA: Diagnosis not present

## 2024-01-07 DIAGNOSIS — K219 Gastro-esophageal reflux disease without esophagitis: Secondary | ICD-10-CM | POA: Diagnosis not present

## 2024-01-07 DIAGNOSIS — Z9981 Dependence on supplemental oxygen: Secondary | ICD-10-CM | POA: Diagnosis not present

## 2024-01-07 DIAGNOSIS — F03B3 Unspecified dementia, moderate, with mood disturbance: Secondary | ICD-10-CM | POA: Diagnosis not present

## 2024-01-07 DIAGNOSIS — F0390 Unspecified dementia without behavioral disturbance: Secondary | ICD-10-CM | POA: Diagnosis not present

## 2024-01-07 DIAGNOSIS — Z7401 Bed confinement status: Secondary | ICD-10-CM | POA: Diagnosis not present

## 2024-01-07 DIAGNOSIS — J441 Chronic obstructive pulmonary disease with (acute) exacerbation: Secondary | ICD-10-CM | POA: Diagnosis not present

## 2024-01-07 DIAGNOSIS — I10 Essential (primary) hypertension: Secondary | ICD-10-CM | POA: Diagnosis not present

## 2024-01-07 DIAGNOSIS — R279 Unspecified lack of coordination: Secondary | ICD-10-CM | POA: Diagnosis not present

## 2024-01-07 DIAGNOSIS — M1990 Unspecified osteoarthritis, unspecified site: Secondary | ICD-10-CM | POA: Diagnosis not present

## 2024-01-07 DIAGNOSIS — J9611 Chronic respiratory failure with hypoxia: Secondary | ICD-10-CM | POA: Diagnosis not present

## 2024-01-07 DIAGNOSIS — U071 COVID-19: Secondary | ICD-10-CM | POA: Diagnosis not present

## 2024-01-12 DIAGNOSIS — I4510 Unspecified right bundle-branch block: Secondary | ICD-10-CM

## 2024-01-16 DIAGNOSIS — M6259 Muscle wasting and atrophy, not elsewhere classified, multiple sites: Secondary | ICD-10-CM | POA: Diagnosis not present

## 2024-01-16 DIAGNOSIS — R293 Abnormal posture: Secondary | ICD-10-CM | POA: Diagnosis not present

## 2024-01-16 DIAGNOSIS — M6281 Muscle weakness (generalized): Secondary | ICD-10-CM | POA: Diagnosis not present

## 2024-01-16 DIAGNOSIS — R2681 Unsteadiness on feet: Secondary | ICD-10-CM | POA: Diagnosis not present

## 2024-01-16 DIAGNOSIS — R296 Repeated falls: Secondary | ICD-10-CM | POA: Diagnosis not present

## 2024-01-16 DIAGNOSIS — R2689 Other abnormalities of gait and mobility: Secondary | ICD-10-CM | POA: Diagnosis not present

## 2024-01-16 DIAGNOSIS — M6289 Other specified disorders of muscle: Secondary | ICD-10-CM | POA: Diagnosis not present

## 2024-01-19 DIAGNOSIS — D649 Anemia, unspecified: Secondary | ICD-10-CM | POA: Diagnosis not present

## 2024-01-19 DIAGNOSIS — K648 Other hemorrhoids: Secondary | ICD-10-CM | POA: Diagnosis not present

## 2024-01-19 DIAGNOSIS — R000 Tachycardia, unspecified: Secondary | ICD-10-CM | POA: Diagnosis not present

## 2024-01-19 DIAGNOSIS — R2689 Other abnormalities of gait and mobility: Secondary | ICD-10-CM | POA: Diagnosis not present

## 2024-01-19 DIAGNOSIS — F411 Generalized anxiety disorder: Secondary | ICD-10-CM | POA: Diagnosis not present

## 2024-01-19 DIAGNOSIS — M6289 Other specified disorders of muscle: Secondary | ICD-10-CM | POA: Diagnosis not present

## 2024-01-19 DIAGNOSIS — M6259 Muscle wasting and atrophy, not elsewhere classified, multiple sites: Secondary | ICD-10-CM | POA: Diagnosis not present

## 2024-01-19 DIAGNOSIS — R2681 Unsteadiness on feet: Secondary | ICD-10-CM | POA: Diagnosis not present

## 2024-01-19 DIAGNOSIS — R293 Abnormal posture: Secondary | ICD-10-CM | POA: Diagnosis not present

## 2024-01-19 DIAGNOSIS — I10 Essential (primary) hypertension: Secondary | ICD-10-CM | POA: Diagnosis not present

## 2024-01-19 DIAGNOSIS — J441 Chronic obstructive pulmonary disease with (acute) exacerbation: Secondary | ICD-10-CM | POA: Diagnosis not present

## 2024-01-19 DIAGNOSIS — U071 COVID-19: Secondary | ICD-10-CM | POA: Diagnosis not present

## 2024-01-19 DIAGNOSIS — M6281 Muscle weakness (generalized): Secondary | ICD-10-CM | POA: Diagnosis not present

## 2024-01-19 DIAGNOSIS — R296 Repeated falls: Secondary | ICD-10-CM | POA: Diagnosis not present

## 2024-01-19 DIAGNOSIS — J9621 Acute and chronic respiratory failure with hypoxia: Secondary | ICD-10-CM | POA: Diagnosis not present

## 2024-01-20 DIAGNOSIS — R2689 Other abnormalities of gait and mobility: Secondary | ICD-10-CM | POA: Diagnosis not present

## 2024-01-20 DIAGNOSIS — R296 Repeated falls: Secondary | ICD-10-CM | POA: Diagnosis not present

## 2024-01-20 DIAGNOSIS — M6289 Other specified disorders of muscle: Secondary | ICD-10-CM | POA: Diagnosis not present

## 2024-01-20 DIAGNOSIS — M6259 Muscle wasting and atrophy, not elsewhere classified, multiple sites: Secondary | ICD-10-CM | POA: Diagnosis not present

## 2024-01-20 DIAGNOSIS — M6281 Muscle weakness (generalized): Secondary | ICD-10-CM | POA: Diagnosis not present

## 2024-01-20 DIAGNOSIS — R2681 Unsteadiness on feet: Secondary | ICD-10-CM | POA: Diagnosis not present

## 2024-01-20 DIAGNOSIS — R293 Abnormal posture: Secondary | ICD-10-CM | POA: Diagnosis not present

## 2024-01-21 DIAGNOSIS — R293 Abnormal posture: Secondary | ICD-10-CM | POA: Diagnosis not present

## 2024-01-21 DIAGNOSIS — M6281 Muscle weakness (generalized): Secondary | ICD-10-CM | POA: Diagnosis not present

## 2024-01-21 DIAGNOSIS — M6259 Muscle wasting and atrophy, not elsewhere classified, multiple sites: Secondary | ICD-10-CM | POA: Diagnosis not present

## 2024-01-21 DIAGNOSIS — R2689 Other abnormalities of gait and mobility: Secondary | ICD-10-CM | POA: Diagnosis not present

## 2024-01-21 DIAGNOSIS — R296 Repeated falls: Secondary | ICD-10-CM | POA: Diagnosis not present

## 2024-01-21 DIAGNOSIS — R2681 Unsteadiness on feet: Secondary | ICD-10-CM | POA: Diagnosis not present

## 2024-01-21 DIAGNOSIS — M6289 Other specified disorders of muscle: Secondary | ICD-10-CM | POA: Diagnosis not present

## 2024-01-26 DIAGNOSIS — R2689 Other abnormalities of gait and mobility: Secondary | ICD-10-CM | POA: Diagnosis not present

## 2024-01-26 DIAGNOSIS — M6289 Other specified disorders of muscle: Secondary | ICD-10-CM | POA: Diagnosis not present

## 2024-01-26 DIAGNOSIS — R2681 Unsteadiness on feet: Secondary | ICD-10-CM | POA: Diagnosis not present

## 2024-01-26 DIAGNOSIS — R296 Repeated falls: Secondary | ICD-10-CM | POA: Diagnosis not present

## 2024-01-26 DIAGNOSIS — M6259 Muscle wasting and atrophy, not elsewhere classified, multiple sites: Secondary | ICD-10-CM | POA: Diagnosis not present

## 2024-01-26 DIAGNOSIS — R293 Abnormal posture: Secondary | ICD-10-CM | POA: Diagnosis not present

## 2024-01-26 DIAGNOSIS — M6281 Muscle weakness (generalized): Secondary | ICD-10-CM | POA: Diagnosis not present

## 2024-01-27 DIAGNOSIS — M6281 Muscle weakness (generalized): Secondary | ICD-10-CM | POA: Diagnosis not present

## 2024-01-27 DIAGNOSIS — M6259 Muscle wasting and atrophy, not elsewhere classified, multiple sites: Secondary | ICD-10-CM | POA: Diagnosis not present

## 2024-01-27 DIAGNOSIS — M6289 Other specified disorders of muscle: Secondary | ICD-10-CM | POA: Diagnosis not present

## 2024-01-27 DIAGNOSIS — F3341 Major depressive disorder, recurrent, in partial remission: Secondary | ICD-10-CM | POA: Diagnosis not present

## 2024-01-27 DIAGNOSIS — R293 Abnormal posture: Secondary | ICD-10-CM | POA: Diagnosis not present

## 2024-01-27 DIAGNOSIS — R2689 Other abnormalities of gait and mobility: Secondary | ICD-10-CM | POA: Diagnosis not present

## 2024-01-27 DIAGNOSIS — R2681 Unsteadiness on feet: Secondary | ICD-10-CM | POA: Diagnosis not present

## 2024-01-27 DIAGNOSIS — R296 Repeated falls: Secondary | ICD-10-CM | POA: Diagnosis not present

## 2024-01-28 DIAGNOSIS — R293 Abnormal posture: Secondary | ICD-10-CM | POA: Diagnosis not present

## 2024-01-28 DIAGNOSIS — R2689 Other abnormalities of gait and mobility: Secondary | ICD-10-CM | POA: Diagnosis not present

## 2024-01-28 DIAGNOSIS — M6281 Muscle weakness (generalized): Secondary | ICD-10-CM | POA: Diagnosis not present

## 2024-01-28 DIAGNOSIS — R2681 Unsteadiness on feet: Secondary | ICD-10-CM | POA: Diagnosis not present

## 2024-01-28 DIAGNOSIS — M6259 Muscle wasting and atrophy, not elsewhere classified, multiple sites: Secondary | ICD-10-CM | POA: Diagnosis not present

## 2024-01-28 DIAGNOSIS — R296 Repeated falls: Secondary | ICD-10-CM | POA: Diagnosis not present

## 2024-01-28 DIAGNOSIS — M6289 Other specified disorders of muscle: Secondary | ICD-10-CM | POA: Diagnosis not present

## 2024-01-30 DIAGNOSIS — M6259 Muscle wasting and atrophy, not elsewhere classified, multiple sites: Secondary | ICD-10-CM | POA: Diagnosis not present

## 2024-01-30 DIAGNOSIS — R2681 Unsteadiness on feet: Secondary | ICD-10-CM | POA: Diagnosis not present

## 2024-01-30 DIAGNOSIS — M6289 Other specified disorders of muscle: Secondary | ICD-10-CM | POA: Diagnosis not present

## 2024-01-30 DIAGNOSIS — M6281 Muscle weakness (generalized): Secondary | ICD-10-CM | POA: Diagnosis not present

## 2024-01-30 DIAGNOSIS — R2689 Other abnormalities of gait and mobility: Secondary | ICD-10-CM | POA: Diagnosis not present

## 2024-01-30 DIAGNOSIS — R296 Repeated falls: Secondary | ICD-10-CM | POA: Diagnosis not present

## 2024-01-30 DIAGNOSIS — R293 Abnormal posture: Secondary | ICD-10-CM | POA: Diagnosis not present

## 2024-02-03 DIAGNOSIS — M6289 Other specified disorders of muscle: Secondary | ICD-10-CM | POA: Diagnosis not present

## 2024-02-03 DIAGNOSIS — R296 Repeated falls: Secondary | ICD-10-CM | POA: Diagnosis not present

## 2024-02-03 DIAGNOSIS — R293 Abnormal posture: Secondary | ICD-10-CM | POA: Diagnosis not present

## 2024-02-03 DIAGNOSIS — R2681 Unsteadiness on feet: Secondary | ICD-10-CM | POA: Diagnosis not present

## 2024-02-03 DIAGNOSIS — M6259 Muscle wasting and atrophy, not elsewhere classified, multiple sites: Secondary | ICD-10-CM | POA: Diagnosis not present

## 2024-02-03 DIAGNOSIS — M6281 Muscle weakness (generalized): Secondary | ICD-10-CM | POA: Diagnosis not present

## 2024-02-03 DIAGNOSIS — R2689 Other abnormalities of gait and mobility: Secondary | ICD-10-CM | POA: Diagnosis not present

## 2024-02-04 DIAGNOSIS — R2689 Other abnormalities of gait and mobility: Secondary | ICD-10-CM | POA: Diagnosis not present

## 2024-02-04 DIAGNOSIS — R293 Abnormal posture: Secondary | ICD-10-CM | POA: Diagnosis not present

## 2024-02-04 DIAGNOSIS — M6281 Muscle weakness (generalized): Secondary | ICD-10-CM | POA: Diagnosis not present

## 2024-02-04 DIAGNOSIS — R2681 Unsteadiness on feet: Secondary | ICD-10-CM | POA: Diagnosis not present

## 2024-02-04 DIAGNOSIS — R296 Repeated falls: Secondary | ICD-10-CM | POA: Diagnosis not present

## 2024-02-04 DIAGNOSIS — M6289 Other specified disorders of muscle: Secondary | ICD-10-CM | POA: Diagnosis not present

## 2024-02-04 DIAGNOSIS — M6259 Muscle wasting and atrophy, not elsewhere classified, multiple sites: Secondary | ICD-10-CM | POA: Diagnosis not present

## 2024-02-05 DIAGNOSIS — R2689 Other abnormalities of gait and mobility: Secondary | ICD-10-CM | POA: Diagnosis not present

## 2024-02-05 DIAGNOSIS — R2681 Unsteadiness on feet: Secondary | ICD-10-CM | POA: Diagnosis not present

## 2024-02-05 DIAGNOSIS — M6259 Muscle wasting and atrophy, not elsewhere classified, multiple sites: Secondary | ICD-10-CM | POA: Diagnosis not present

## 2024-02-05 DIAGNOSIS — M6289 Other specified disorders of muscle: Secondary | ICD-10-CM | POA: Diagnosis not present

## 2024-02-05 DIAGNOSIS — F3341 Major depressive disorder, recurrent, in partial remission: Secondary | ICD-10-CM | POA: Diagnosis not present

## 2024-02-05 DIAGNOSIS — R293 Abnormal posture: Secondary | ICD-10-CM | POA: Diagnosis not present

## 2024-02-05 DIAGNOSIS — R296 Repeated falls: Secondary | ICD-10-CM | POA: Diagnosis not present

## 2024-02-05 DIAGNOSIS — M6281 Muscle weakness (generalized): Secondary | ICD-10-CM | POA: Diagnosis not present

## 2024-02-06 DIAGNOSIS — R293 Abnormal posture: Secondary | ICD-10-CM | POA: Diagnosis not present

## 2024-02-06 DIAGNOSIS — M6289 Other specified disorders of muscle: Secondary | ICD-10-CM | POA: Diagnosis not present

## 2024-02-06 DIAGNOSIS — M6259 Muscle wasting and atrophy, not elsewhere classified, multiple sites: Secondary | ICD-10-CM | POA: Diagnosis not present

## 2024-02-06 DIAGNOSIS — R2681 Unsteadiness on feet: Secondary | ICD-10-CM | POA: Diagnosis not present

## 2024-02-06 DIAGNOSIS — M6281 Muscle weakness (generalized): Secondary | ICD-10-CM | POA: Diagnosis not present

## 2024-02-06 DIAGNOSIS — R296 Repeated falls: Secondary | ICD-10-CM | POA: Diagnosis not present

## 2024-02-06 DIAGNOSIS — R2689 Other abnormalities of gait and mobility: Secondary | ICD-10-CM | POA: Diagnosis not present

## 2024-02-09 DIAGNOSIS — R2681 Unsteadiness on feet: Secondary | ICD-10-CM | POA: Diagnosis not present

## 2024-02-09 DIAGNOSIS — I10 Essential (primary) hypertension: Secondary | ICD-10-CM | POA: Diagnosis not present

## 2024-02-09 DIAGNOSIS — M6289 Other specified disorders of muscle: Secondary | ICD-10-CM | POA: Diagnosis not present

## 2024-02-09 DIAGNOSIS — M6281 Muscle weakness (generalized): Secondary | ICD-10-CM | POA: Diagnosis not present

## 2024-02-09 DIAGNOSIS — M6259 Muscle wasting and atrophy, not elsewhere classified, multiple sites: Secondary | ICD-10-CM | POA: Diagnosis not present

## 2024-02-09 DIAGNOSIS — F411 Generalized anxiety disorder: Secondary | ICD-10-CM | POA: Diagnosis not present

## 2024-02-09 DIAGNOSIS — J441 Chronic obstructive pulmonary disease with (acute) exacerbation: Secondary | ICD-10-CM | POA: Diagnosis not present

## 2024-02-09 DIAGNOSIS — H0100B Unspecified blepharitis left eye, upper and lower eyelids: Secondary | ICD-10-CM | POA: Diagnosis not present

## 2024-02-09 DIAGNOSIS — R293 Abnormal posture: Secondary | ICD-10-CM | POA: Diagnosis not present

## 2024-02-09 DIAGNOSIS — R2689 Other abnormalities of gait and mobility: Secondary | ICD-10-CM | POA: Diagnosis not present

## 2024-02-09 DIAGNOSIS — R296 Repeated falls: Secondary | ICD-10-CM | POA: Diagnosis not present

## 2024-02-10 DIAGNOSIS — M6289 Other specified disorders of muscle: Secondary | ICD-10-CM | POA: Diagnosis not present

## 2024-02-10 DIAGNOSIS — M6259 Muscle wasting and atrophy, not elsewhere classified, multiple sites: Secondary | ICD-10-CM | POA: Diagnosis not present

## 2024-02-10 DIAGNOSIS — M6281 Muscle weakness (generalized): Secondary | ICD-10-CM | POA: Diagnosis not present

## 2024-02-10 DIAGNOSIS — R293 Abnormal posture: Secondary | ICD-10-CM | POA: Diagnosis not present

## 2024-02-10 DIAGNOSIS — R296 Repeated falls: Secondary | ICD-10-CM | POA: Diagnosis not present

## 2024-02-10 DIAGNOSIS — R2689 Other abnormalities of gait and mobility: Secondary | ICD-10-CM | POA: Diagnosis not present

## 2024-02-10 DIAGNOSIS — R2681 Unsteadiness on feet: Secondary | ICD-10-CM | POA: Diagnosis not present

## 2024-02-11 DIAGNOSIS — R2681 Unsteadiness on feet: Secondary | ICD-10-CM | POA: Diagnosis not present

## 2024-02-11 DIAGNOSIS — M6289 Other specified disorders of muscle: Secondary | ICD-10-CM | POA: Diagnosis not present

## 2024-02-11 DIAGNOSIS — R293 Abnormal posture: Secondary | ICD-10-CM | POA: Diagnosis not present

## 2024-02-11 DIAGNOSIS — R2689 Other abnormalities of gait and mobility: Secondary | ICD-10-CM | POA: Diagnosis not present

## 2024-02-11 DIAGNOSIS — R296 Repeated falls: Secondary | ICD-10-CM | POA: Diagnosis not present

## 2024-02-11 DIAGNOSIS — M6259 Muscle wasting and atrophy, not elsewhere classified, multiple sites: Secondary | ICD-10-CM | POA: Diagnosis not present

## 2024-02-11 DIAGNOSIS — M6281 Muscle weakness (generalized): Secondary | ICD-10-CM | POA: Diagnosis not present

## 2024-02-12 DIAGNOSIS — R293 Abnormal posture: Secondary | ICD-10-CM | POA: Diagnosis not present

## 2024-02-12 DIAGNOSIS — R296 Repeated falls: Secondary | ICD-10-CM | POA: Diagnosis not present

## 2024-02-12 DIAGNOSIS — M6281 Muscle weakness (generalized): Secondary | ICD-10-CM | POA: Diagnosis not present

## 2024-02-12 DIAGNOSIS — R2689 Other abnormalities of gait and mobility: Secondary | ICD-10-CM | POA: Diagnosis not present

## 2024-02-12 DIAGNOSIS — R2681 Unsteadiness on feet: Secondary | ICD-10-CM | POA: Diagnosis not present

## 2024-02-12 DIAGNOSIS — M6259 Muscle wasting and atrophy, not elsewhere classified, multiple sites: Secondary | ICD-10-CM | POA: Diagnosis not present

## 2024-02-12 DIAGNOSIS — M6289 Other specified disorders of muscle: Secondary | ICD-10-CM | POA: Diagnosis not present

## 2024-02-16 DIAGNOSIS — M6289 Other specified disorders of muscle: Secondary | ICD-10-CM | POA: Diagnosis not present

## 2024-02-16 DIAGNOSIS — R296 Repeated falls: Secondary | ICD-10-CM | POA: Diagnosis not present

## 2024-02-16 DIAGNOSIS — M6259 Muscle wasting and atrophy, not elsewhere classified, multiple sites: Secondary | ICD-10-CM | POA: Diagnosis not present

## 2024-02-16 DIAGNOSIS — M6281 Muscle weakness (generalized): Secondary | ICD-10-CM | POA: Diagnosis not present

## 2024-02-16 DIAGNOSIS — R293 Abnormal posture: Secondary | ICD-10-CM | POA: Diagnosis not present

## 2024-02-16 DIAGNOSIS — R2689 Other abnormalities of gait and mobility: Secondary | ICD-10-CM | POA: Diagnosis not present

## 2024-02-16 DIAGNOSIS — R2681 Unsteadiness on feet: Secondary | ICD-10-CM | POA: Diagnosis not present

## 2024-02-17 DIAGNOSIS — F3341 Major depressive disorder, recurrent, in partial remission: Secondary | ICD-10-CM | POA: Diagnosis not present

## 2024-02-18 DIAGNOSIS — R2681 Unsteadiness on feet: Secondary | ICD-10-CM | POA: Diagnosis not present

## 2024-02-18 DIAGNOSIS — R296 Repeated falls: Secondary | ICD-10-CM | POA: Diagnosis not present

## 2024-02-18 DIAGNOSIS — M6281 Muscle weakness (generalized): Secondary | ICD-10-CM | POA: Diagnosis not present

## 2024-02-18 DIAGNOSIS — M6289 Other specified disorders of muscle: Secondary | ICD-10-CM | POA: Diagnosis not present

## 2024-02-18 DIAGNOSIS — M6259 Muscle wasting and atrophy, not elsewhere classified, multiple sites: Secondary | ICD-10-CM | POA: Diagnosis not present

## 2024-02-18 DIAGNOSIS — R293 Abnormal posture: Secondary | ICD-10-CM | POA: Diagnosis not present

## 2024-02-18 DIAGNOSIS — R2689 Other abnormalities of gait and mobility: Secondary | ICD-10-CM | POA: Diagnosis not present

## 2024-02-19 DIAGNOSIS — F330 Major depressive disorder, recurrent, mild: Secondary | ICD-10-CM | POA: Diagnosis not present

## 2024-02-19 DIAGNOSIS — R2689 Other abnormalities of gait and mobility: Secondary | ICD-10-CM | POA: Diagnosis not present

## 2024-02-19 DIAGNOSIS — M6259 Muscle wasting and atrophy, not elsewhere classified, multiple sites: Secondary | ICD-10-CM | POA: Diagnosis not present

## 2024-02-19 DIAGNOSIS — M6289 Other specified disorders of muscle: Secondary | ICD-10-CM | POA: Diagnosis not present

## 2024-02-19 DIAGNOSIS — R296 Repeated falls: Secondary | ICD-10-CM | POA: Diagnosis not present

## 2024-02-19 DIAGNOSIS — R293 Abnormal posture: Secondary | ICD-10-CM | POA: Diagnosis not present

## 2024-02-19 DIAGNOSIS — M6281 Muscle weakness (generalized): Secondary | ICD-10-CM | POA: Diagnosis not present

## 2024-02-19 DIAGNOSIS — R2681 Unsteadiness on feet: Secondary | ICD-10-CM | POA: Diagnosis not present

## 2024-02-20 DIAGNOSIS — M6281 Muscle weakness (generalized): Secondary | ICD-10-CM | POA: Diagnosis not present

## 2024-02-20 DIAGNOSIS — R2689 Other abnormalities of gait and mobility: Secondary | ICD-10-CM | POA: Diagnosis not present

## 2024-02-20 DIAGNOSIS — R296 Repeated falls: Secondary | ICD-10-CM | POA: Diagnosis not present

## 2024-02-20 DIAGNOSIS — R293 Abnormal posture: Secondary | ICD-10-CM | POA: Diagnosis not present

## 2024-02-20 DIAGNOSIS — R2681 Unsteadiness on feet: Secondary | ICD-10-CM | POA: Diagnosis not present

## 2024-02-20 DIAGNOSIS — M6289 Other specified disorders of muscle: Secondary | ICD-10-CM | POA: Diagnosis not present

## 2024-02-20 DIAGNOSIS — M6259 Muscle wasting and atrophy, not elsewhere classified, multiple sites: Secondary | ICD-10-CM | POA: Diagnosis not present

## 2024-02-26 DIAGNOSIS — R293 Abnormal posture: Secondary | ICD-10-CM | POA: Diagnosis not present

## 2024-02-26 DIAGNOSIS — M6289 Other specified disorders of muscle: Secondary | ICD-10-CM | POA: Diagnosis not present

## 2024-02-26 DIAGNOSIS — M6281 Muscle weakness (generalized): Secondary | ICD-10-CM | POA: Diagnosis not present

## 2024-02-26 DIAGNOSIS — R2681 Unsteadiness on feet: Secondary | ICD-10-CM | POA: Diagnosis not present

## 2024-02-26 DIAGNOSIS — M6259 Muscle wasting and atrophy, not elsewhere classified, multiple sites: Secondary | ICD-10-CM | POA: Diagnosis not present

## 2024-02-26 DIAGNOSIS — R2689 Other abnormalities of gait and mobility: Secondary | ICD-10-CM | POA: Diagnosis not present

## 2024-02-26 DIAGNOSIS — R296 Repeated falls: Secondary | ICD-10-CM | POA: Diagnosis not present

## 2024-03-01 DIAGNOSIS — M6259 Muscle wasting and atrophy, not elsewhere classified, multiple sites: Secondary | ICD-10-CM | POA: Diagnosis not present

## 2024-03-01 DIAGNOSIS — R293 Abnormal posture: Secondary | ICD-10-CM | POA: Diagnosis not present

## 2024-03-01 DIAGNOSIS — R2689 Other abnormalities of gait and mobility: Secondary | ICD-10-CM | POA: Diagnosis not present

## 2024-03-01 DIAGNOSIS — R296 Repeated falls: Secondary | ICD-10-CM | POA: Diagnosis not present

## 2024-03-01 DIAGNOSIS — M6289 Other specified disorders of muscle: Secondary | ICD-10-CM | POA: Diagnosis not present

## 2024-03-01 DIAGNOSIS — M6281 Muscle weakness (generalized): Secondary | ICD-10-CM | POA: Diagnosis not present

## 2024-03-01 DIAGNOSIS — R2681 Unsteadiness on feet: Secondary | ICD-10-CM | POA: Diagnosis not present

## 2024-03-04 DIAGNOSIS — R293 Abnormal posture: Secondary | ICD-10-CM | POA: Diagnosis not present

## 2024-03-04 DIAGNOSIS — M6281 Muscle weakness (generalized): Secondary | ICD-10-CM | POA: Diagnosis not present

## 2024-03-04 DIAGNOSIS — R296 Repeated falls: Secondary | ICD-10-CM | POA: Diagnosis not present

## 2024-03-04 DIAGNOSIS — R2689 Other abnormalities of gait and mobility: Secondary | ICD-10-CM | POA: Diagnosis not present

## 2024-03-04 DIAGNOSIS — H01112 Allergic dermatitis of right lower eyelid: Secondary | ICD-10-CM | POA: Diagnosis not present

## 2024-03-04 DIAGNOSIS — K648 Other hemorrhoids: Secondary | ICD-10-CM | POA: Diagnosis not present

## 2024-03-04 DIAGNOSIS — M6289 Other specified disorders of muscle: Secondary | ICD-10-CM | POA: Diagnosis not present

## 2024-03-04 DIAGNOSIS — H01114 Allergic dermatitis of left upper eyelid: Secondary | ICD-10-CM | POA: Diagnosis not present

## 2024-03-04 DIAGNOSIS — J9621 Acute and chronic respiratory failure with hypoxia: Secondary | ICD-10-CM | POA: Diagnosis not present

## 2024-03-04 DIAGNOSIS — H01111 Allergic dermatitis of right upper eyelid: Secondary | ICD-10-CM | POA: Diagnosis not present

## 2024-03-04 DIAGNOSIS — R2681 Unsteadiness on feet: Secondary | ICD-10-CM | POA: Diagnosis not present

## 2024-03-04 DIAGNOSIS — M6259 Muscle wasting and atrophy, not elsewhere classified, multiple sites: Secondary | ICD-10-CM | POA: Diagnosis not present

## 2024-03-04 DIAGNOSIS — H01115 Allergic dermatitis of left lower eyelid: Secondary | ICD-10-CM | POA: Diagnosis not present

## 2024-03-04 DIAGNOSIS — R000 Tachycardia, unspecified: Secondary | ICD-10-CM | POA: Diagnosis not present

## 2024-03-08 DIAGNOSIS — M6259 Muscle wasting and atrophy, not elsewhere classified, multiple sites: Secondary | ICD-10-CM | POA: Diagnosis not present

## 2024-03-08 DIAGNOSIS — M6289 Other specified disorders of muscle: Secondary | ICD-10-CM | POA: Diagnosis not present

## 2024-03-08 DIAGNOSIS — R2689 Other abnormalities of gait and mobility: Secondary | ICD-10-CM | POA: Diagnosis not present

## 2024-03-08 DIAGNOSIS — M6281 Muscle weakness (generalized): Secondary | ICD-10-CM | POA: Diagnosis not present

## 2024-03-08 DIAGNOSIS — R2681 Unsteadiness on feet: Secondary | ICD-10-CM | POA: Diagnosis not present

## 2024-03-08 DIAGNOSIS — R296 Repeated falls: Secondary | ICD-10-CM | POA: Diagnosis not present

## 2024-03-08 DIAGNOSIS — R293 Abnormal posture: Secondary | ICD-10-CM | POA: Diagnosis not present

## 2024-03-09 DIAGNOSIS — R2689 Other abnormalities of gait and mobility: Secondary | ICD-10-CM | POA: Diagnosis not present

## 2024-03-09 DIAGNOSIS — R293 Abnormal posture: Secondary | ICD-10-CM | POA: Diagnosis not present

## 2024-03-09 DIAGNOSIS — R2681 Unsteadiness on feet: Secondary | ICD-10-CM | POA: Diagnosis not present

## 2024-03-09 DIAGNOSIS — M6281 Muscle weakness (generalized): Secondary | ICD-10-CM | POA: Diagnosis not present

## 2024-03-09 DIAGNOSIS — M6289 Other specified disorders of muscle: Secondary | ICD-10-CM | POA: Diagnosis not present

## 2024-03-09 DIAGNOSIS — R296 Repeated falls: Secondary | ICD-10-CM | POA: Diagnosis not present

## 2024-03-09 DIAGNOSIS — M6259 Muscle wasting and atrophy, not elsewhere classified, multiple sites: Secondary | ICD-10-CM | POA: Diagnosis not present

## 2024-03-10 DIAGNOSIS — R296 Repeated falls: Secondary | ICD-10-CM | POA: Diagnosis not present

## 2024-03-10 DIAGNOSIS — R2681 Unsteadiness on feet: Secondary | ICD-10-CM | POA: Diagnosis not present

## 2024-03-10 DIAGNOSIS — M6259 Muscle wasting and atrophy, not elsewhere classified, multiple sites: Secondary | ICD-10-CM | POA: Diagnosis not present

## 2024-03-10 DIAGNOSIS — M6289 Other specified disorders of muscle: Secondary | ICD-10-CM | POA: Diagnosis not present

## 2024-03-10 DIAGNOSIS — R293 Abnormal posture: Secondary | ICD-10-CM | POA: Diagnosis not present

## 2024-03-10 DIAGNOSIS — M6281 Muscle weakness (generalized): Secondary | ICD-10-CM | POA: Diagnosis not present

## 2024-03-10 DIAGNOSIS — R2689 Other abnormalities of gait and mobility: Secondary | ICD-10-CM | POA: Diagnosis not present

## 2024-03-11 DIAGNOSIS — R293 Abnormal posture: Secondary | ICD-10-CM | POA: Diagnosis not present

## 2024-03-11 DIAGNOSIS — R2689 Other abnormalities of gait and mobility: Secondary | ICD-10-CM | POA: Diagnosis not present

## 2024-03-11 DIAGNOSIS — M6289 Other specified disorders of muscle: Secondary | ICD-10-CM | POA: Diagnosis not present

## 2024-03-11 DIAGNOSIS — R2681 Unsteadiness on feet: Secondary | ICD-10-CM | POA: Diagnosis not present

## 2024-03-11 DIAGNOSIS — M6259 Muscle wasting and atrophy, not elsewhere classified, multiple sites: Secondary | ICD-10-CM | POA: Diagnosis not present

## 2024-03-16 DIAGNOSIS — F3341 Major depressive disorder, recurrent, in partial remission: Secondary | ICD-10-CM | POA: Diagnosis not present

## 2024-03-18 DIAGNOSIS — F330 Major depressive disorder, recurrent, mild: Secondary | ICD-10-CM | POA: Diagnosis not present

## 2024-03-22 DIAGNOSIS — R2689 Other abnormalities of gait and mobility: Secondary | ICD-10-CM | POA: Diagnosis not present

## 2024-03-22 DIAGNOSIS — M6289 Other specified disorders of muscle: Secondary | ICD-10-CM | POA: Diagnosis not present

## 2024-03-22 DIAGNOSIS — R2681 Unsteadiness on feet: Secondary | ICD-10-CM | POA: Diagnosis not present

## 2024-03-22 DIAGNOSIS — M6259 Muscle wasting and atrophy, not elsewhere classified, multiple sites: Secondary | ICD-10-CM | POA: Diagnosis not present

## 2024-03-22 DIAGNOSIS — R293 Abnormal posture: Secondary | ICD-10-CM | POA: Diagnosis not present

## 2024-03-22 DIAGNOSIS — F411 Generalized anxiety disorder: Secondary | ICD-10-CM | POA: Diagnosis not present

## 2024-03-22 DIAGNOSIS — I10 Essential (primary) hypertension: Secondary | ICD-10-CM | POA: Diagnosis not present

## 2024-03-22 DIAGNOSIS — J441 Chronic obstructive pulmonary disease with (acute) exacerbation: Secondary | ICD-10-CM | POA: Diagnosis not present

## 2024-04-05 DIAGNOSIS — R000 Tachycardia, unspecified: Secondary | ICD-10-CM | POA: Diagnosis not present

## 2024-04-05 DIAGNOSIS — K648 Other hemorrhoids: Secondary | ICD-10-CM | POA: Diagnosis not present

## 2024-04-05 DIAGNOSIS — J9621 Acute and chronic respiratory failure with hypoxia: Secondary | ICD-10-CM | POA: Diagnosis not present

## 2024-04-13 DIAGNOSIS — F3341 Major depressive disorder, recurrent, in partial remission: Secondary | ICD-10-CM | POA: Diagnosis not present

## 2024-04-15 DIAGNOSIS — J441 Chronic obstructive pulmonary disease with (acute) exacerbation: Secondary | ICD-10-CM | POA: Diagnosis not present

## 2024-04-15 DIAGNOSIS — R41841 Cognitive communication deficit: Secondary | ICD-10-CM | POA: Diagnosis not present

## 2024-04-15 DIAGNOSIS — F330 Major depressive disorder, recurrent, mild: Secondary | ICD-10-CM | POA: Diagnosis not present

## 2024-04-15 DIAGNOSIS — M6289 Other specified disorders of muscle: Secondary | ICD-10-CM | POA: Diagnosis not present

## 2024-04-15 DIAGNOSIS — G3109 Other frontotemporal dementia: Secondary | ICD-10-CM | POA: Diagnosis not present

## 2024-04-15 DIAGNOSIS — R2689 Other abnormalities of gait and mobility: Secondary | ICD-10-CM | POA: Diagnosis not present

## 2024-04-15 DIAGNOSIS — R293 Abnormal posture: Secondary | ICD-10-CM | POA: Diagnosis not present

## 2024-04-15 DIAGNOSIS — J9621 Acute and chronic respiratory failure with hypoxia: Secondary | ICD-10-CM | POA: Diagnosis not present

## 2024-04-15 DIAGNOSIS — J99 Respiratory disorders in diseases classified elsewhere: Secondary | ICD-10-CM | POA: Diagnosis not present

## 2024-04-15 DIAGNOSIS — R2681 Unsteadiness on feet: Secondary | ICD-10-CM | POA: Diagnosis not present

## 2024-04-16 DIAGNOSIS — R2681 Unsteadiness on feet: Secondary | ICD-10-CM | POA: Diagnosis not present

## 2024-04-16 DIAGNOSIS — J99 Respiratory disorders in diseases classified elsewhere: Secondary | ICD-10-CM | POA: Diagnosis not present

## 2024-04-16 DIAGNOSIS — R2689 Other abnormalities of gait and mobility: Secondary | ICD-10-CM | POA: Diagnosis not present

## 2024-04-16 DIAGNOSIS — J9621 Acute and chronic respiratory failure with hypoxia: Secondary | ICD-10-CM | POA: Diagnosis not present

## 2024-04-16 DIAGNOSIS — G3109 Other frontotemporal dementia: Secondary | ICD-10-CM | POA: Diagnosis not present

## 2024-04-16 DIAGNOSIS — R293 Abnormal posture: Secondary | ICD-10-CM | POA: Diagnosis not present

## 2024-04-16 DIAGNOSIS — R41841 Cognitive communication deficit: Secondary | ICD-10-CM | POA: Diagnosis not present

## 2024-04-16 DIAGNOSIS — J441 Chronic obstructive pulmonary disease with (acute) exacerbation: Secondary | ICD-10-CM | POA: Diagnosis not present

## 2024-04-16 DIAGNOSIS — M6289 Other specified disorders of muscle: Secondary | ICD-10-CM | POA: Diagnosis not present

## 2024-04-17 DIAGNOSIS — M6289 Other specified disorders of muscle: Secondary | ICD-10-CM | POA: Diagnosis not present

## 2024-04-17 DIAGNOSIS — R41841 Cognitive communication deficit: Secondary | ICD-10-CM | POA: Diagnosis not present

## 2024-04-17 DIAGNOSIS — J99 Respiratory disorders in diseases classified elsewhere: Secondary | ICD-10-CM | POA: Diagnosis not present

## 2024-04-17 DIAGNOSIS — J441 Chronic obstructive pulmonary disease with (acute) exacerbation: Secondary | ICD-10-CM | POA: Diagnosis not present

## 2024-04-17 DIAGNOSIS — G3109 Other frontotemporal dementia: Secondary | ICD-10-CM | POA: Diagnosis not present

## 2024-04-17 DIAGNOSIS — J9621 Acute and chronic respiratory failure with hypoxia: Secondary | ICD-10-CM | POA: Diagnosis not present

## 2024-04-17 DIAGNOSIS — R2689 Other abnormalities of gait and mobility: Secondary | ICD-10-CM | POA: Diagnosis not present

## 2024-04-17 DIAGNOSIS — R2681 Unsteadiness on feet: Secondary | ICD-10-CM | POA: Diagnosis not present

## 2024-04-17 DIAGNOSIS — R293 Abnormal posture: Secondary | ICD-10-CM | POA: Diagnosis not present

## 2024-04-18 DIAGNOSIS — J441 Chronic obstructive pulmonary disease with (acute) exacerbation: Secondary | ICD-10-CM | POA: Diagnosis not present

## 2024-04-18 DIAGNOSIS — R41841 Cognitive communication deficit: Secondary | ICD-10-CM | POA: Diagnosis not present

## 2024-04-18 DIAGNOSIS — M6289 Other specified disorders of muscle: Secondary | ICD-10-CM | POA: Diagnosis not present

## 2024-04-18 DIAGNOSIS — J9621 Acute and chronic respiratory failure with hypoxia: Secondary | ICD-10-CM | POA: Diagnosis not present

## 2024-04-18 DIAGNOSIS — R2689 Other abnormalities of gait and mobility: Secondary | ICD-10-CM | POA: Diagnosis not present

## 2024-04-18 DIAGNOSIS — R2681 Unsteadiness on feet: Secondary | ICD-10-CM | POA: Diagnosis not present

## 2024-04-18 DIAGNOSIS — J99 Respiratory disorders in diseases classified elsewhere: Secondary | ICD-10-CM | POA: Diagnosis not present

## 2024-04-18 DIAGNOSIS — R293 Abnormal posture: Secondary | ICD-10-CM | POA: Diagnosis not present

## 2024-04-18 DIAGNOSIS — G3109 Other frontotemporal dementia: Secondary | ICD-10-CM | POA: Diagnosis not present

## 2024-04-19 DIAGNOSIS — R2681 Unsteadiness on feet: Secondary | ICD-10-CM | POA: Diagnosis not present

## 2024-04-19 DIAGNOSIS — R293 Abnormal posture: Secondary | ICD-10-CM | POA: Diagnosis not present

## 2024-04-19 DIAGNOSIS — I10 Essential (primary) hypertension: Secondary | ICD-10-CM | POA: Diagnosis not present

## 2024-04-19 DIAGNOSIS — R41841 Cognitive communication deficit: Secondary | ICD-10-CM | POA: Diagnosis not present

## 2024-04-19 DIAGNOSIS — R2689 Other abnormalities of gait and mobility: Secondary | ICD-10-CM | POA: Diagnosis not present

## 2024-04-19 DIAGNOSIS — F411 Generalized anxiety disorder: Secondary | ICD-10-CM | POA: Diagnosis not present

## 2024-04-19 DIAGNOSIS — R000 Tachycardia, unspecified: Secondary | ICD-10-CM | POA: Diagnosis not present

## 2024-04-19 DIAGNOSIS — J441 Chronic obstructive pulmonary disease with (acute) exacerbation: Secondary | ICD-10-CM | POA: Diagnosis not present

## 2024-04-19 DIAGNOSIS — M6289 Other specified disorders of muscle: Secondary | ICD-10-CM | POA: Diagnosis not present

## 2024-04-19 DIAGNOSIS — G3109 Other frontotemporal dementia: Secondary | ICD-10-CM | POA: Diagnosis not present

## 2024-04-19 DIAGNOSIS — J99 Respiratory disorders in diseases classified elsewhere: Secondary | ICD-10-CM | POA: Diagnosis not present

## 2024-04-19 DIAGNOSIS — J9621 Acute and chronic respiratory failure with hypoxia: Secondary | ICD-10-CM | POA: Diagnosis not present

## 2024-04-21 DIAGNOSIS — R293 Abnormal posture: Secondary | ICD-10-CM | POA: Diagnosis not present

## 2024-04-21 DIAGNOSIS — R2689 Other abnormalities of gait and mobility: Secondary | ICD-10-CM | POA: Diagnosis not present

## 2024-04-21 DIAGNOSIS — R2681 Unsteadiness on feet: Secondary | ICD-10-CM | POA: Diagnosis not present

## 2024-04-21 DIAGNOSIS — M6289 Other specified disorders of muscle: Secondary | ICD-10-CM | POA: Diagnosis not present

## 2024-04-21 DIAGNOSIS — R41841 Cognitive communication deficit: Secondary | ICD-10-CM | POA: Diagnosis not present

## 2024-04-21 DIAGNOSIS — J9621 Acute and chronic respiratory failure with hypoxia: Secondary | ICD-10-CM | POA: Diagnosis not present

## 2024-04-21 DIAGNOSIS — G3109 Other frontotemporal dementia: Secondary | ICD-10-CM | POA: Diagnosis not present

## 2024-04-21 DIAGNOSIS — J99 Respiratory disorders in diseases classified elsewhere: Secondary | ICD-10-CM | POA: Diagnosis not present

## 2024-04-21 DIAGNOSIS — J441 Chronic obstructive pulmonary disease with (acute) exacerbation: Secondary | ICD-10-CM | POA: Diagnosis not present

## 2024-04-22 DIAGNOSIS — R41841 Cognitive communication deficit: Secondary | ICD-10-CM | POA: Diagnosis not present

## 2024-04-22 DIAGNOSIS — R293 Abnormal posture: Secondary | ICD-10-CM | POA: Diagnosis not present

## 2024-04-22 DIAGNOSIS — J441 Chronic obstructive pulmonary disease with (acute) exacerbation: Secondary | ICD-10-CM | POA: Diagnosis not present

## 2024-04-22 DIAGNOSIS — M6289 Other specified disorders of muscle: Secondary | ICD-10-CM | POA: Diagnosis not present

## 2024-04-22 DIAGNOSIS — R2681 Unsteadiness on feet: Secondary | ICD-10-CM | POA: Diagnosis not present

## 2024-04-22 DIAGNOSIS — G3109 Other frontotemporal dementia: Secondary | ICD-10-CM | POA: Diagnosis not present

## 2024-04-22 DIAGNOSIS — J99 Respiratory disorders in diseases classified elsewhere: Secondary | ICD-10-CM | POA: Diagnosis not present

## 2024-04-22 DIAGNOSIS — R2689 Other abnormalities of gait and mobility: Secondary | ICD-10-CM | POA: Diagnosis not present

## 2024-04-22 DIAGNOSIS — J9621 Acute and chronic respiratory failure with hypoxia: Secondary | ICD-10-CM | POA: Diagnosis not present

## 2024-04-27 DIAGNOSIS — R2689 Other abnormalities of gait and mobility: Secondary | ICD-10-CM | POA: Diagnosis not present

## 2024-04-27 DIAGNOSIS — J9621 Acute and chronic respiratory failure with hypoxia: Secondary | ICD-10-CM | POA: Diagnosis not present

## 2024-04-27 DIAGNOSIS — R2681 Unsteadiness on feet: Secondary | ICD-10-CM | POA: Diagnosis not present

## 2024-04-27 DIAGNOSIS — R293 Abnormal posture: Secondary | ICD-10-CM | POA: Diagnosis not present

## 2024-04-27 DIAGNOSIS — G3109 Other frontotemporal dementia: Secondary | ICD-10-CM | POA: Diagnosis not present

## 2024-04-27 DIAGNOSIS — J441 Chronic obstructive pulmonary disease with (acute) exacerbation: Secondary | ICD-10-CM | POA: Diagnosis not present

## 2024-04-27 DIAGNOSIS — R41841 Cognitive communication deficit: Secondary | ICD-10-CM | POA: Diagnosis not present

## 2024-04-27 DIAGNOSIS — J99 Respiratory disorders in diseases classified elsewhere: Secondary | ICD-10-CM | POA: Diagnosis not present

## 2024-04-27 DIAGNOSIS — M6289 Other specified disorders of muscle: Secondary | ICD-10-CM | POA: Diagnosis not present

## 2024-04-28 DIAGNOSIS — R293 Abnormal posture: Secondary | ICD-10-CM | POA: Diagnosis not present

## 2024-04-28 DIAGNOSIS — J9621 Acute and chronic respiratory failure with hypoxia: Secondary | ICD-10-CM | POA: Diagnosis not present

## 2024-04-28 DIAGNOSIS — J441 Chronic obstructive pulmonary disease with (acute) exacerbation: Secondary | ICD-10-CM | POA: Diagnosis not present

## 2024-04-28 DIAGNOSIS — R2689 Other abnormalities of gait and mobility: Secondary | ICD-10-CM | POA: Diagnosis not present

## 2024-04-28 DIAGNOSIS — R41841 Cognitive communication deficit: Secondary | ICD-10-CM | POA: Diagnosis not present

## 2024-04-28 DIAGNOSIS — J99 Respiratory disorders in diseases classified elsewhere: Secondary | ICD-10-CM | POA: Diagnosis not present

## 2024-04-28 DIAGNOSIS — R2681 Unsteadiness on feet: Secondary | ICD-10-CM | POA: Diagnosis not present

## 2024-04-28 DIAGNOSIS — M6289 Other specified disorders of muscle: Secondary | ICD-10-CM | POA: Diagnosis not present

## 2024-04-28 DIAGNOSIS — G3109 Other frontotemporal dementia: Secondary | ICD-10-CM | POA: Diagnosis not present

## 2024-04-29 DIAGNOSIS — G3109 Other frontotemporal dementia: Secondary | ICD-10-CM | POA: Diagnosis not present

## 2024-04-29 DIAGNOSIS — J441 Chronic obstructive pulmonary disease with (acute) exacerbation: Secondary | ICD-10-CM | POA: Diagnosis not present

## 2024-04-29 DIAGNOSIS — R2681 Unsteadiness on feet: Secondary | ICD-10-CM | POA: Diagnosis not present

## 2024-04-29 DIAGNOSIS — R293 Abnormal posture: Secondary | ICD-10-CM | POA: Diagnosis not present

## 2024-04-29 DIAGNOSIS — J99 Respiratory disorders in diseases classified elsewhere: Secondary | ICD-10-CM | POA: Diagnosis not present

## 2024-04-29 DIAGNOSIS — M6289 Other specified disorders of muscle: Secondary | ICD-10-CM | POA: Diagnosis not present

## 2024-04-29 DIAGNOSIS — R2689 Other abnormalities of gait and mobility: Secondary | ICD-10-CM | POA: Diagnosis not present

## 2024-04-29 DIAGNOSIS — J9621 Acute and chronic respiratory failure with hypoxia: Secondary | ICD-10-CM | POA: Diagnosis not present

## 2024-04-29 DIAGNOSIS — R41841 Cognitive communication deficit: Secondary | ICD-10-CM | POA: Diagnosis not present

## 2024-05-03 DIAGNOSIS — J441 Chronic obstructive pulmonary disease with (acute) exacerbation: Secondary | ICD-10-CM | POA: Diagnosis not present

## 2024-05-03 DIAGNOSIS — I10 Essential (primary) hypertension: Secondary | ICD-10-CM | POA: Diagnosis not present

## 2024-05-03 DIAGNOSIS — F411 Generalized anxiety disorder: Secondary | ICD-10-CM | POA: Diagnosis not present

## 2024-05-04 DIAGNOSIS — R293 Abnormal posture: Secondary | ICD-10-CM | POA: Diagnosis not present

## 2024-05-04 DIAGNOSIS — J441 Chronic obstructive pulmonary disease with (acute) exacerbation: Secondary | ICD-10-CM | POA: Diagnosis not present

## 2024-05-04 DIAGNOSIS — J99 Respiratory disorders in diseases classified elsewhere: Secondary | ICD-10-CM | POA: Diagnosis not present

## 2024-05-04 DIAGNOSIS — G3109 Other frontotemporal dementia: Secondary | ICD-10-CM | POA: Diagnosis not present

## 2024-05-04 DIAGNOSIS — M6289 Other specified disorders of muscle: Secondary | ICD-10-CM | POA: Diagnosis not present

## 2024-05-04 DIAGNOSIS — J9621 Acute and chronic respiratory failure with hypoxia: Secondary | ICD-10-CM | POA: Diagnosis not present

## 2024-05-04 DIAGNOSIS — R2681 Unsteadiness on feet: Secondary | ICD-10-CM | POA: Diagnosis not present

## 2024-05-04 DIAGNOSIS — R2689 Other abnormalities of gait and mobility: Secondary | ICD-10-CM | POA: Diagnosis not present

## 2024-05-04 DIAGNOSIS — D649 Anemia, unspecified: Secondary | ICD-10-CM | POA: Diagnosis not present

## 2024-05-04 DIAGNOSIS — R41841 Cognitive communication deficit: Secondary | ICD-10-CM | POA: Diagnosis not present

## 2024-05-05 DIAGNOSIS — R2689 Other abnormalities of gait and mobility: Secondary | ICD-10-CM | POA: Diagnosis not present

## 2024-05-05 DIAGNOSIS — J441 Chronic obstructive pulmonary disease with (acute) exacerbation: Secondary | ICD-10-CM | POA: Diagnosis not present

## 2024-05-05 DIAGNOSIS — J9621 Acute and chronic respiratory failure with hypoxia: Secondary | ICD-10-CM | POA: Diagnosis not present

## 2024-05-05 DIAGNOSIS — M6289 Other specified disorders of muscle: Secondary | ICD-10-CM | POA: Diagnosis not present

## 2024-05-05 DIAGNOSIS — G3109 Other frontotemporal dementia: Secondary | ICD-10-CM | POA: Diagnosis not present

## 2024-05-05 DIAGNOSIS — R293 Abnormal posture: Secondary | ICD-10-CM | POA: Diagnosis not present

## 2024-05-05 DIAGNOSIS — R2681 Unsteadiness on feet: Secondary | ICD-10-CM | POA: Diagnosis not present

## 2024-05-05 DIAGNOSIS — J99 Respiratory disorders in diseases classified elsewhere: Secondary | ICD-10-CM | POA: Diagnosis not present

## 2024-05-05 DIAGNOSIS — R41841 Cognitive communication deficit: Secondary | ICD-10-CM | POA: Diagnosis not present

## 2024-05-07 DIAGNOSIS — J99 Respiratory disorders in diseases classified elsewhere: Secondary | ICD-10-CM | POA: Diagnosis not present

## 2024-05-07 DIAGNOSIS — J9621 Acute and chronic respiratory failure with hypoxia: Secondary | ICD-10-CM | POA: Diagnosis not present

## 2024-05-07 DIAGNOSIS — R2689 Other abnormalities of gait and mobility: Secondary | ICD-10-CM | POA: Diagnosis not present

## 2024-05-07 DIAGNOSIS — M6289 Other specified disorders of muscle: Secondary | ICD-10-CM | POA: Diagnosis not present

## 2024-05-07 DIAGNOSIS — G3109 Other frontotemporal dementia: Secondary | ICD-10-CM | POA: Diagnosis not present

## 2024-05-07 DIAGNOSIS — R293 Abnormal posture: Secondary | ICD-10-CM | POA: Diagnosis not present

## 2024-05-07 DIAGNOSIS — R41841 Cognitive communication deficit: Secondary | ICD-10-CM | POA: Diagnosis not present

## 2024-05-07 DIAGNOSIS — J441 Chronic obstructive pulmonary disease with (acute) exacerbation: Secondary | ICD-10-CM | POA: Diagnosis not present

## 2024-05-07 DIAGNOSIS — R2681 Unsteadiness on feet: Secondary | ICD-10-CM | POA: Diagnosis not present

## 2024-05-10 DIAGNOSIS — R41841 Cognitive communication deficit: Secondary | ICD-10-CM | POA: Diagnosis not present

## 2024-05-10 DIAGNOSIS — R000 Tachycardia, unspecified: Secondary | ICD-10-CM | POA: Diagnosis not present

## 2024-05-10 DIAGNOSIS — G3109 Other frontotemporal dementia: Secondary | ICD-10-CM | POA: Diagnosis not present

## 2024-05-10 DIAGNOSIS — J99 Respiratory disorders in diseases classified elsewhere: Secondary | ICD-10-CM | POA: Diagnosis not present

## 2024-05-10 DIAGNOSIS — J9621 Acute and chronic respiratory failure with hypoxia: Secondary | ICD-10-CM | POA: Diagnosis not present

## 2024-05-10 DIAGNOSIS — M6289 Other specified disorders of muscle: Secondary | ICD-10-CM | POA: Diagnosis not present

## 2024-05-10 DIAGNOSIS — R2689 Other abnormalities of gait and mobility: Secondary | ICD-10-CM | POA: Diagnosis not present

## 2024-05-10 DIAGNOSIS — J441 Chronic obstructive pulmonary disease with (acute) exacerbation: Secondary | ICD-10-CM | POA: Diagnosis not present

## 2024-05-10 DIAGNOSIS — R293 Abnormal posture: Secondary | ICD-10-CM | POA: Diagnosis not present

## 2024-05-10 DIAGNOSIS — K648 Other hemorrhoids: Secondary | ICD-10-CM | POA: Diagnosis not present

## 2024-05-10 DIAGNOSIS — R2681 Unsteadiness on feet: Secondary | ICD-10-CM | POA: Diagnosis not present

## 2024-05-11 DIAGNOSIS — G3109 Other frontotemporal dementia: Secondary | ICD-10-CM | POA: Diagnosis not present

## 2024-05-11 DIAGNOSIS — R2689 Other abnormalities of gait and mobility: Secondary | ICD-10-CM | POA: Diagnosis not present

## 2024-05-11 DIAGNOSIS — F330 Major depressive disorder, recurrent, mild: Secondary | ICD-10-CM | POA: Diagnosis not present

## 2024-05-11 DIAGNOSIS — R293 Abnormal posture: Secondary | ICD-10-CM | POA: Diagnosis not present

## 2024-05-11 DIAGNOSIS — R2681 Unsteadiness on feet: Secondary | ICD-10-CM | POA: Diagnosis not present

## 2024-05-11 DIAGNOSIS — M6289 Other specified disorders of muscle: Secondary | ICD-10-CM | POA: Diagnosis not present

## 2024-05-13 DIAGNOSIS — F330 Major depressive disorder, recurrent, mild: Secondary | ICD-10-CM | POA: Diagnosis not present

## 2024-05-27 DIAGNOSIS — F411 Generalized anxiety disorder: Secondary | ICD-10-CM | POA: Diagnosis not present

## 2024-05-27 DIAGNOSIS — J441 Chronic obstructive pulmonary disease with (acute) exacerbation: Secondary | ICD-10-CM | POA: Diagnosis not present

## 2024-05-27 DIAGNOSIS — I10 Essential (primary) hypertension: Secondary | ICD-10-CM | POA: Diagnosis not present

## 2024-06-08 DIAGNOSIS — F3341 Major depressive disorder, recurrent, in partial remission: Secondary | ICD-10-CM | POA: Diagnosis not present

## 2024-06-09 DIAGNOSIS — J9621 Acute and chronic respiratory failure with hypoxia: Secondary | ICD-10-CM | POA: Diagnosis not present

## 2024-06-09 DIAGNOSIS — F411 Generalized anxiety disorder: Secondary | ICD-10-CM | POA: Diagnosis not present

## 2024-06-09 DIAGNOSIS — I10 Essential (primary) hypertension: Secondary | ICD-10-CM | POA: Diagnosis not present

## 2024-06-09 DIAGNOSIS — J441 Chronic obstructive pulmonary disease with (acute) exacerbation: Secondary | ICD-10-CM | POA: Diagnosis not present

## 2024-06-24 DIAGNOSIS — F330 Major depressive disorder, recurrent, mild: Secondary | ICD-10-CM | POA: Diagnosis not present

## 2024-06-28 DIAGNOSIS — H60391 Other infective otitis externa, right ear: Secondary | ICD-10-CM | POA: Diagnosis not present

## 2024-06-28 DIAGNOSIS — F411 Generalized anxiety disorder: Secondary | ICD-10-CM | POA: Diagnosis not present

## 2024-06-28 DIAGNOSIS — J441 Chronic obstructive pulmonary disease with (acute) exacerbation: Secondary | ICD-10-CM | POA: Diagnosis not present

## 2024-06-28 DIAGNOSIS — I10 Essential (primary) hypertension: Secondary | ICD-10-CM | POA: Diagnosis not present

## 2024-06-28 DIAGNOSIS — J9621 Acute and chronic respiratory failure with hypoxia: Secondary | ICD-10-CM | POA: Diagnosis not present

## 2024-06-30 DIAGNOSIS — J9621 Acute and chronic respiratory failure with hypoxia: Secondary | ICD-10-CM | POA: Diagnosis not present

## 2024-06-30 DIAGNOSIS — J99 Respiratory disorders in diseases classified elsewhere: Secondary | ICD-10-CM | POA: Diagnosis not present

## 2024-06-30 DIAGNOSIS — J159 Unspecified bacterial pneumonia: Secondary | ICD-10-CM | POA: Diagnosis not present

## 2024-06-30 DIAGNOSIS — R2681 Unsteadiness on feet: Secondary | ICD-10-CM | POA: Diagnosis not present

## 2024-06-30 DIAGNOSIS — R0602 Shortness of breath: Secondary | ICD-10-CM | POA: Diagnosis not present

## 2024-06-30 DIAGNOSIS — J440 Chronic obstructive pulmonary disease with acute lower respiratory infection: Secondary | ICD-10-CM | POA: Diagnosis not present

## 2024-06-30 DIAGNOSIS — J441 Chronic obstructive pulmonary disease with (acute) exacerbation: Secondary | ICD-10-CM | POA: Diagnosis not present

## 2024-06-30 DIAGNOSIS — F03B4 Unspecified dementia, moderate, with anxiety: Secondary | ICD-10-CM | POA: Diagnosis not present

## 2024-06-30 DIAGNOSIS — I10 Essential (primary) hypertension: Secondary | ICD-10-CM | POA: Diagnosis not present

## 2024-06-30 DIAGNOSIS — M6259 Muscle wasting and atrophy, not elsewhere classified, multiple sites: Secondary | ICD-10-CM | POA: Diagnosis not present

## 2024-06-30 DIAGNOSIS — J9690 Respiratory failure, unspecified, unspecified whether with hypoxia or hypercapnia: Secondary | ICD-10-CM | POA: Diagnosis not present

## 2024-06-30 DIAGNOSIS — R531 Weakness: Secondary | ICD-10-CM | POA: Diagnosis not present

## 2024-06-30 DIAGNOSIS — F411 Generalized anxiety disorder: Secondary | ICD-10-CM | POA: Diagnosis not present

## 2024-06-30 DIAGNOSIS — R296 Repeated falls: Secondary | ICD-10-CM | POA: Diagnosis not present

## 2024-06-30 DIAGNOSIS — R000 Tachycardia, unspecified: Secondary | ICD-10-CM | POA: Diagnosis not present

## 2024-06-30 DIAGNOSIS — M6281 Muscle weakness (generalized): Secondary | ICD-10-CM | POA: Diagnosis not present

## 2024-06-30 DIAGNOSIS — J189 Pneumonia, unspecified organism: Secondary | ICD-10-CM | POA: Diagnosis not present

## 2024-06-30 DIAGNOSIS — H60391 Other infective otitis externa, right ear: Secondary | ICD-10-CM | POA: Diagnosis not present

## 2024-06-30 DIAGNOSIS — G43909 Migraine, unspecified, not intractable, without status migrainosus: Secondary | ICD-10-CM | POA: Diagnosis not present

## 2024-06-30 DIAGNOSIS — Z743 Need for continuous supervision: Secondary | ICD-10-CM | POA: Diagnosis not present

## 2024-06-30 DIAGNOSIS — R41841 Cognitive communication deficit: Secondary | ICD-10-CM | POA: Diagnosis not present

## 2024-06-30 DIAGNOSIS — M6289 Other specified disorders of muscle: Secondary | ICD-10-CM | POA: Diagnosis not present

## 2024-06-30 DIAGNOSIS — R062 Wheezing: Secondary | ICD-10-CM | POA: Diagnosis not present

## 2024-06-30 DIAGNOSIS — F03B3 Unspecified dementia, moderate, with mood disturbance: Secondary | ICD-10-CM | POA: Diagnosis not present

## 2024-07-03 DIAGNOSIS — J9621 Acute and chronic respiratory failure with hypoxia: Secondary | ICD-10-CM | POA: Diagnosis not present

## 2024-07-03 DIAGNOSIS — M6289 Other specified disorders of muscle: Secondary | ICD-10-CM | POA: Diagnosis not present

## 2024-07-03 DIAGNOSIS — R2681 Unsteadiness on feet: Secondary | ICD-10-CM | POA: Diagnosis not present

## 2024-07-03 DIAGNOSIS — J189 Pneumonia, unspecified organism: Secondary | ICD-10-CM | POA: Diagnosis not present

## 2024-07-03 DIAGNOSIS — R0602 Shortness of breath: Secondary | ICD-10-CM | POA: Diagnosis not present

## 2024-07-03 DIAGNOSIS — E119 Type 2 diabetes mellitus without complications: Secondary | ICD-10-CM | POA: Diagnosis not present

## 2024-07-03 DIAGNOSIS — M6259 Muscle wasting and atrophy, not elsewhere classified, multiple sites: Secondary | ICD-10-CM | POA: Diagnosis not present

## 2024-07-03 DIAGNOSIS — I10 Essential (primary) hypertension: Secondary | ICD-10-CM | POA: Diagnosis not present

## 2024-07-03 DIAGNOSIS — R296 Repeated falls: Secondary | ICD-10-CM | POA: Diagnosis not present

## 2024-07-03 DIAGNOSIS — J441 Chronic obstructive pulmonary disease with (acute) exacerbation: Secondary | ICD-10-CM | POA: Diagnosis not present

## 2024-07-03 DIAGNOSIS — R41841 Cognitive communication deficit: Secondary | ICD-10-CM | POA: Diagnosis not present

## 2024-07-03 DIAGNOSIS — N390 Urinary tract infection, site not specified: Secondary | ICD-10-CM | POA: Diagnosis not present

## 2024-07-03 DIAGNOSIS — J80 Acute respiratory distress syndrome: Secondary | ICD-10-CM | POA: Diagnosis not present

## 2024-07-03 DIAGNOSIS — R609 Edema, unspecified: Secondary | ICD-10-CM | POA: Diagnosis not present

## 2024-07-03 DIAGNOSIS — J90 Pleural effusion, not elsewhere classified: Secondary | ICD-10-CM | POA: Diagnosis not present

## 2024-07-03 DIAGNOSIS — A419 Sepsis, unspecified organism: Secondary | ICD-10-CM | POA: Diagnosis not present

## 2024-07-03 DIAGNOSIS — R9431 Abnormal electrocardiogram [ECG] [EKG]: Secondary | ICD-10-CM | POA: Diagnosis not present

## 2024-07-03 DIAGNOSIS — N179 Acute kidney failure, unspecified: Secondary | ICD-10-CM | POA: Diagnosis not present

## 2024-07-03 DIAGNOSIS — Z743 Need for continuous supervision: Secondary | ICD-10-CM | POA: Diagnosis not present

## 2024-07-03 DIAGNOSIS — M6281 Muscle weakness (generalized): Secondary | ICD-10-CM | POA: Diagnosis not present

## 2024-07-03 DIAGNOSIS — J984 Other disorders of lung: Secondary | ICD-10-CM | POA: Diagnosis not present

## 2024-07-03 DIAGNOSIS — J9811 Atelectasis: Secondary | ICD-10-CM | POA: Diagnosis not present

## 2024-07-03 DIAGNOSIS — J99 Respiratory disorders in diseases classified elsewhere: Secondary | ICD-10-CM | POA: Diagnosis not present

## 2024-07-03 DIAGNOSIS — F330 Major depressive disorder, recurrent, mild: Secondary | ICD-10-CM | POA: Diagnosis not present

## 2024-07-03 DIAGNOSIS — R531 Weakness: Secondary | ICD-10-CM | POA: Diagnosis not present

## 2024-07-05 DIAGNOSIS — J9621 Acute and chronic respiratory failure with hypoxia: Secondary | ICD-10-CM | POA: Diagnosis not present

## 2024-07-05 DIAGNOSIS — J441 Chronic obstructive pulmonary disease with (acute) exacerbation: Secondary | ICD-10-CM | POA: Diagnosis not present

## 2024-07-05 DIAGNOSIS — J189 Pneumonia, unspecified organism: Secondary | ICD-10-CM | POA: Diagnosis not present

## 2024-07-06 DIAGNOSIS — F330 Major depressive disorder, recurrent, mild: Secondary | ICD-10-CM | POA: Diagnosis not present

## 2024-07-12 DIAGNOSIS — R0602 Shortness of breath: Secondary | ICD-10-CM | POA: Diagnosis not present

## 2024-07-12 DIAGNOSIS — R2681 Unsteadiness on feet: Secondary | ICD-10-CM | POA: Diagnosis not present

## 2024-07-12 DIAGNOSIS — R41841 Cognitive communication deficit: Secondary | ICD-10-CM | POA: Diagnosis not present

## 2024-07-12 DIAGNOSIS — J441 Chronic obstructive pulmonary disease with (acute) exacerbation: Secondary | ICD-10-CM | POA: Diagnosis not present

## 2024-07-12 DIAGNOSIS — A419 Sepsis, unspecified organism: Secondary | ICD-10-CM | POA: Diagnosis not present

## 2024-07-12 DIAGNOSIS — R296 Repeated falls: Secondary | ICD-10-CM | POA: Diagnosis not present

## 2024-07-12 DIAGNOSIS — J80 Acute respiratory distress syndrome: Secondary | ICD-10-CM | POA: Diagnosis not present

## 2024-07-12 DIAGNOSIS — J90 Pleural effusion, not elsewhere classified: Secondary | ICD-10-CM | POA: Diagnosis not present

## 2024-07-12 DIAGNOSIS — M6259 Muscle wasting and atrophy, not elsewhere classified, multiple sites: Secondary | ICD-10-CM | POA: Diagnosis not present

## 2024-07-12 DIAGNOSIS — N179 Acute kidney failure, unspecified: Secondary | ICD-10-CM | POA: Diagnosis not present

## 2024-07-12 DIAGNOSIS — M6289 Other specified disorders of muscle: Secondary | ICD-10-CM | POA: Diagnosis not present

## 2024-07-12 DIAGNOSIS — J984 Other disorders of lung: Secondary | ICD-10-CM | POA: Diagnosis not present

## 2024-07-12 DIAGNOSIS — J9621 Acute and chronic respiratory failure with hypoxia: Secondary | ICD-10-CM | POA: Diagnosis not present

## 2024-07-12 DIAGNOSIS — R609 Edema, unspecified: Secondary | ICD-10-CM | POA: Diagnosis not present

## 2024-07-12 DIAGNOSIS — M6281 Muscle weakness (generalized): Secondary | ICD-10-CM | POA: Diagnosis not present

## 2024-07-12 DIAGNOSIS — J99 Respiratory disorders in diseases classified elsewhere: Secondary | ICD-10-CM | POA: Diagnosis not present

## 2024-07-12 DIAGNOSIS — R9431 Abnormal electrocardiogram [ECG] [EKG]: Secondary | ICD-10-CM | POA: Diagnosis not present

## 2024-07-14 DIAGNOSIS — J189 Pneumonia, unspecified organism: Secondary | ICD-10-CM | POA: Diagnosis not present

## 2024-07-14 DIAGNOSIS — J9621 Acute and chronic respiratory failure with hypoxia: Secondary | ICD-10-CM | POA: Diagnosis not present

## 2024-07-14 DIAGNOSIS — J441 Chronic obstructive pulmonary disease with (acute) exacerbation: Secondary | ICD-10-CM | POA: Diagnosis not present

## 2024-07-19 DIAGNOSIS — J440 Chronic obstructive pulmonary disease with acute lower respiratory infection: Secondary | ICD-10-CM | POA: Diagnosis not present

## 2024-07-19 DIAGNOSIS — J189 Pneumonia, unspecified organism: Secondary | ICD-10-CM | POA: Diagnosis not present

## 2024-07-19 DIAGNOSIS — J9602 Acute respiratory failure with hypercapnia: Secondary | ICD-10-CM | POA: Diagnosis not present

## 2024-07-19 DIAGNOSIS — J9601 Acute respiratory failure with hypoxia: Secondary | ICD-10-CM | POA: Diagnosis not present

## 2024-07-19 DIAGNOSIS — N179 Acute kidney failure, unspecified: Secondary | ICD-10-CM | POA: Diagnosis not present

## 2024-07-19 DIAGNOSIS — J9811 Atelectasis: Secondary | ICD-10-CM | POA: Diagnosis not present

## 2024-07-19 DIAGNOSIS — N170 Acute kidney failure with tubular necrosis: Secondary | ICD-10-CM | POA: Diagnosis not present

## 2024-07-19 DIAGNOSIS — J159 Unspecified bacterial pneumonia: Secondary | ICD-10-CM | POA: Diagnosis not present

## 2024-07-19 DIAGNOSIS — J9690 Respiratory failure, unspecified, unspecified whether with hypoxia or hypercapnia: Secondary | ICD-10-CM | POA: Diagnosis not present

## 2024-07-19 DIAGNOSIS — A419 Sepsis, unspecified organism: Secondary | ICD-10-CM | POA: Diagnosis not present

## 2024-07-19 DIAGNOSIS — R34 Anuria and oliguria: Secondary | ICD-10-CM | POA: Diagnosis not present

## 2024-07-19 DIAGNOSIS — R0602 Shortness of breath: Secondary | ICD-10-CM | POA: Diagnosis not present

## 2024-07-19 DIAGNOSIS — K7200 Acute and subacute hepatic failure without coma: Secondary | ICD-10-CM | POA: Diagnosis not present

## 2024-07-19 DIAGNOSIS — J9621 Acute and chronic respiratory failure with hypoxia: Secondary | ICD-10-CM | POA: Diagnosis not present

## 2024-07-19 DIAGNOSIS — J90 Pleural effusion, not elsewhere classified: Secondary | ICD-10-CM | POA: Diagnosis not present

## 2024-07-19 DIAGNOSIS — J441 Chronic obstructive pulmonary disease with (acute) exacerbation: Secondary | ICD-10-CM | POA: Diagnosis not present

## 2024-07-19 DIAGNOSIS — Z4682 Encounter for fitting and adjustment of non-vascular catheter: Secondary | ICD-10-CM | POA: Diagnosis not present

## 2024-07-19 DIAGNOSIS — R9431 Abnormal electrocardiogram [ECG] [EKG]: Secondary | ICD-10-CM | POA: Diagnosis not present

## 2024-07-19 DIAGNOSIS — E875 Hyperkalemia: Secondary | ICD-10-CM | POA: Diagnosis not present

## 2024-07-19 DIAGNOSIS — R6521 Severe sepsis with septic shock: Secondary | ICD-10-CM | POA: Diagnosis not present

## 2024-07-19 DIAGNOSIS — J9620 Acute and chronic respiratory failure, unspecified whether with hypoxia or hypercapnia: Secondary | ICD-10-CM | POA: Diagnosis not present

## 2024-07-19 DIAGNOSIS — R6520 Severe sepsis without septic shock: Secondary | ICD-10-CM | POA: Diagnosis not present

## 2024-07-19 DIAGNOSIS — Z452 Encounter for adjustment and management of vascular access device: Secondary | ICD-10-CM | POA: Diagnosis not present

## 2024-07-19 DIAGNOSIS — J181 Lobar pneumonia, unspecified organism: Secondary | ICD-10-CM | POA: Diagnosis not present

## 2024-08-11 DEATH — deceased
